# Patient Record
Sex: Female | Born: 1975 | Race: White | Hispanic: No | Marital: Married | Smoking: Never smoker
Health system: Southern US, Community
[De-identification: ages and names within clinical notes are randomized; demographics above are authoritative.]

## PROBLEM LIST (undated history)

## (undated) ENCOUNTER — Inpatient Hospital Stay (HOSPITAL_COMMUNITY): Payer: Self-pay

## (undated) DIAGNOSIS — G43909 Migraine, unspecified, not intractable, without status migrainosus: Secondary | ICD-10-CM

## (undated) HISTORY — PX: APPENDECTOMY: SHX54

## (undated) HISTORY — PX: TUBAL LIGATION: SHX77

---

## 2009-01-21 ENCOUNTER — Ambulatory Visit: Payer: Self-pay | Admitting: Family Medicine

## 2009-05-27 ENCOUNTER — Ambulatory Visit: Payer: Self-pay | Admitting: Family Medicine

## 2009-06-11 ENCOUNTER — Ambulatory Visit: Payer: Self-pay | Admitting: Family Medicine

## 2009-06-11 DIAGNOSIS — J069 Acute upper respiratory infection, unspecified: Secondary | ICD-10-CM | POA: Insufficient documentation

## 2009-06-11 DIAGNOSIS — J0100 Acute maxillary sinusitis, unspecified: Secondary | ICD-10-CM

## 2009-12-30 ENCOUNTER — Ambulatory Visit: Payer: Self-pay | Admitting: Emergency Medicine

## 2009-12-30 LAB — CONVERTED CEMR LAB
Specific Gravity, Urine: >=1.03
Urobilinogen, UA: 0.2

## 2009-12-31 ENCOUNTER — Encounter: Payer: Self-pay | Admitting: Emergency Medicine

## 2010-01-03 ENCOUNTER — Telehealth (INDEPENDENT_AMBULATORY_CARE_PROVIDER_SITE_OTHER): Payer: Self-pay

## 2010-05-09 NOTE — Assessment & Plan Note (Signed)
Summary: sinus pressure, cough-dry x 2 weeks rm 1   Vital Signs:  Patient Profile:   35 Years Old Female CC:      Cold & URI symptoms Height:     69.5 inches Weight:      150 pounds O2 Sat:      100 % O2 treatment:    Room Air Temp:     97.3 degrees F oral Pulse rate:   87 / minute Pulse rhythm:   regular Resp:     18 per minute BP sitting:   109 / 75  (right arm) Cuff size:   regular  Vitals Entered By: Areta Haber CMA (June 11, 2009 10:55 AM)                  Current Allergies: ! CODEINE ! KEFLEX   History of Present Illness Chief Complaint: Cold & URI symptoms History of Present Illness: Subjective:  Patient complains of onset of cold-like illness about 10 days ago with typical scratchy throat, nasal congestion, and cough.  All symptoms improved until the past two days when she developed increased facial pressure, sinus congestion and chills.  She is still having cough at night.  Current Meds DAYQUIL MULTI-SYMPTOM 30-325-10 MG/15ML LIQD (PSEUDOEPHEDRINE-APAP-DM) as directed CLARITHROMYCIN 500 MG TABS (CLARITHROMYCIN) One Tab by mouth two times a day BENZONATATE 200 MG CAPS (BENZONATATE) One by mouth hs as needed cough  REVIEW OF SYSTEMS Constitutional Symptoms       Complains of fever.     Denies chills, night sweats, weight loss, weight gain, and fatigue.  Eyes       Denies change in vision, eye pain, eye discharge, glasses, contact lenses, and eye surgery. Ear/Nose/Throat/Mouth       Complains of ear pain, frequent runny nose, sinus problems, and sore throat.      Denies hearing loss/aids, change in hearing, ear discharge, dizziness, frequent nose bleeds, hoarseness, and tooth pain or bleeding.      Comments: Pressure Respiratory       Complains of dry cough.      Denies productive cough, wheezing, shortness of breath, asthma, bronchitis, and emphysema/COPD.  Cardiovascular       Denies murmurs, chest pain, and tires easily with exhertion.     Gastrointestinal       Denies stomach pain, nausea/vomiting, diarrhea, constipation, blood in bowel movements, and indigestion. Genitourniary       Denies painful urination, kidney stones, and loss of urinary control. Neurological       Complains of headaches.      Denies paralysis, seizures, and fainting/blackouts. Musculoskeletal       Denies muscle pain, joint pain, joint stiffness, decreased range of motion, redness, swelling, muscle weakness, and gout.  Skin       Denies bruising, unusual mles/lumps or sores, and hair/skin or nail changes.  Psych       Denies mood changes, temper/anger issues, anxiety/stress, speech problems, depression, and sleep problems.  Past History:  Past Medical History: Last updated: 01/21/2009 Migraine  Past Surgical History: Last updated: 01/21/2009 Appendectomy  Family History: Last updated: 01/21/2009 Mother HTN, Hyperlipidemia, Arthritis Father CAD Brother Healthy  Social History: Last updated: 01/21/2009 Occupation: Married Former Smoker Alcohol use-yes Drug use-no Regular exercise-no  Risk Factors: Exercise: no (01/21/2009)  Risk Factors: Smoking Status: quit (01/21/2009)   Objective:  No acute distress  Eyes:  Pupils are equal, round, and reactive to light and accomdation.  Extraocular movement is intact.  Conjunctivae are  not inflamed.  Ears:  Canals normal.  Tympanic membranes normal.   Nose:  Normal septum.  Normal turbinates, mildly congested.  Maxillary sinus tenderness present.  Pharynx:  Normal  Lungs:  Clear to auscultation.  Breath sounds are equal.  Heart:  Regular rate and rhythm without murmurs, rubs, or gallops.  Abdomen:  Nontender without masses or hepatosplenomegaly.  Bowel sounds are present.  No CVA or flank tenderness.  Assessment New Problems: URI (ICD-465.9) ACUTE MAXILLARY SINUSITIS (ICD-461.0)   Plan New Medications/Changes: BENZONATATE 200 MG CAPS (BENZONATATE) One by mouth hs as needed  cough  #12 x 0, 06/11/2009, Donna Christen MD CLARITHROMYCIN 500 MG TABS (CLARITHROMYCIN) One Tab by mouth two times a day  #20 x 0, 06/11/2009, Donna Christen MD  New Orders: Est. Patient Level III [phone] Planning Comments:   Begin Biaxin, expectorant/decongestant, cough suprressant at night, topical decongestant Rest, increased fluids. Follow-up with PCP if not improving.   The patient and/or caregiver has been counseled thoroughly with regard to medications prescribed including dosage, schedule, interactions, rationale for use, and possible side effects and they verbalize understanding.  Diagnoses and expected course of recovery discussed and will return if not improved as expected or if the condition worsens. Patient and/or caregiver verbalized understanding.  Prescriptions: BENZONATATE 200 MG CAPS (BENZONATATE) One by mouth hs as needed cough  #12 x 0   Entered and Authorized by:   Donna Christen MD   Signed by:   Donna Christen MD on 06/11/2009   Method used:   Print then Give to Patient   RxID:   [id number] CLARITHROMYCIN 500 MG TABS (CLARITHROMYCIN) One Tab by mouth two times a day  #20 x 0   Entered and Authorized by:   Donna Christen MD   Signed by:   Donna Christen MD on 06/11/2009   Method used:   Print then Give to Patient   RxID:   [id number]   Patient Instructions: 1)  May use Mucinex D (guaifenesin with decongestant) twice daily for congestion. 2)  Increase fluid intake, rest. 3)  May use Afrin nasal spray (or generic oxymetazoline) twice daily for about 5 days.  Also recommend using saline nasal spray several times daily and/or saline nasal irrigation. 4)  Followup with family doctor if not improving 7 to 10 days.

## 2010-05-09 NOTE — Assessment & Plan Note (Signed)
Summary: FLU SHOT/TJ  Nurse Visit Flu Vaccine Consent Questions     Do you have a history of severe allergic reactions to this vaccine? no    Any prior history of allergic reactions to egg and/or gelatin? no    Do you have a sensitivity to the preservative Thimersol? no    Do you have a past history of Guillan-Barre Syndrome? no    Do you currently have an acute febrile illness? no    Have you ever had a severe reaction to latex? no    Vaccine information given and explained to patient? yes    Are you currently pregnant? no    Lot Number:[id number]   Exp Date:07/07/2009   Manufacturer: [employer]    Site Given  right Deltoid IM   Vital Signs:  Patient profile:   35 year old female Temp:     98.1 degrees F oral  Vitals Entered By: Lajean Saver, RN  Allergies: 1)  ! Codeine  Orders Added: 1)  Admin 1st Vaccine [90471] 2)  Flu Vaccine 26yrs + [57846]

## 2010-05-09 NOTE — Assessment & Plan Note (Signed)
Summary: POSSIBLE UTI/WB   Vital Signs:  Patient Profile:   35 Years Old Melanie [name] CC:      dysuria, pelvic pain intermittent LMP:     12/14/2009 Height:     69.5 inches O2 Sat:      100 % O2 treatment:    Room Air Pulse rate:   Melanie / minute Resp:     12 per minute BP sitting:   108 / 78  (left arm) Cuff size:   regular  Pt. in pain?   yes    Location:   pelvis    Type:       sharp/pressure  Vitals Entered By: Lajean Saver RN (December 30, 2009 8:06 AM)  Menstrual History: LMP (date): 12/14/2009 LMP - Character: normal                   Updated Prior Medication List: No Medications Current Allergies (reviewed today): ! CODEINE ! KEFLEXHistory of Present Illness History from: patient Chief Complaint: dysuria, pelvic pain intermittent History of Present Illness: Patient complains of UTI symptoms off and on for 6 weeks, worse this past week  She describes the pain as burning during urination.  She has used OTC Azo and Cranberry which has helped. + dysuria + frequency + urgency No hematuria Mild vaginal discharge No fever/chills No lower abdomenal pain + Mild L back pain   REVIEW OF SYSTEMS Constitutional Symptoms      Denies fever, chills, night sweats, weight loss, weight gain, and fatigue.  Eyes       Denies change in vision, eye pain, eye discharge, glasses, contact lenses, and eye surgery. Ear/Nose/Throat/Mouth       Denies hearing loss/aids, change in hearing, ear pain, ear discharge, dizziness, frequent runny nose, frequent nose bleeds, sinus problems, sore throat, hoarseness, and tooth pain or bleeding.  Respiratory       Denies dry cough, productive cough, wheezing, shortness of breath, asthma, bronchitis, and emphysema/COPD.  Cardiovascular       Denies murmurs, chest pain, and tires easily with exhertion.    Gastrointestinal       Denies stomach pain, nausea/vomiting, diarrhea, constipation, blood in bowel movements, and indigestion. Genitourniary       Complains of painful urination.      Denies blood or discharge from vagina, kidney stones, and loss of urinary control.      Comments: pelvic pain Neurological       Denies paralysis, seizures, and fainting/blackouts. Musculoskeletal       Denies muscle pain, joint pain, joint stiffness, decreased range of motion, redness, swelling, muscle weakness, and gout.  Skin       Denies bruising, unusual mles/lumps or sores, and hair/skin or nail changes.  Psych       Denies mood changes, temper/anger issues, anxiety/stress, speech problems, depression, and sleep problems.  Past History:  Past Medical History: Reviewed history from 01/21/2009 and no changes required. Migraine  Past Surgical History: Reviewed history from 01/21/2009 and no changes required. Appendectomy  Family History: Reviewed history from 01/21/2009 and no changes required. Mother HTN, Hyperlipidemia, Arthritis Father CAD Brother Healthy  Social History:  Married Former Smoker Alcohol use-yes Drug use-no Regular exercise-no Physical Exam General appearance: well developed, well nourished, no acute distress Chest/Lungs: no rales, wheezes, or rhonchi bilateral, breath sounds equal without effort Heart: regular rate and  rhythm, no murmur Abdomen: soft, non-tender without obvious organomegaly Back: no CVAT Skin: no obvious rashes or lesions MSE: oriented to time, place,  and person Assessment New Problems: URINARY TRACT INFECTION (ICD-599.0)  Likely UTI  Patient Education: Patient and/or caregiver instructed in the following: rest, fluids.  Plan New Medications/Changes: BACTRIM DS 800-160 MG TABS (SULFAMETHOXAZOLE-TRIMETHOPRIM) 1 tab by mouth two times a day for 7 days  #14 x 0, 12/30/2009, Hoyt Koch MD  New Orders: Est. Patient Level III [phone] UA Dipstick w/o Micro (automated)  [81003] Urine Pregnancy Test  [81025] T-Culture, Urine [[id number]] Planning Comments:   Hydration OTC Azo  for 2 days if needed Wipe front to back Follow-up with your primary care physician if not improving or if getting worse.  Or next week for your annual pap smear   The patient and/or caregiver has been counseled thoroughly with regard to medications prescribed including dosage, schedule, interactions, rationale for use, and possible side effects and they verbalize understanding.  Diagnoses and expected course of recovery discussed and will return if not improved as expected or if the condition worsens. Patient and/or caregiver verbalized understanding.  Prescriptions: BACTRIM DS 800-160 MG TABS (SULFAMETHOXAZOLE-TRIMETHOPRIM) 1 tab by mouth two times a day for 7 days  #14 x 0   Entered and Authorized by:   Hoyt Koch MD   Signed by:   Hoyt Koch MD on 12/30/2009   Method used:   Printed then faxed to ...       [facility]* (retail)       [address] [place], [place]  [address]       Ph: [phone]       Fax: [phone]   RxID:   [id number]   Orders Added: 1)  Est. Patient Level III [36144] 2)  UA Dipstick w/o Micro (automated)  [81003] 3)  Urine Pregnancy Test  [81025] 4)  T-Culture, Urine [[id number]]  Laboratory Results   Urine Tests  Date/Time Received: December 30, 2009 8:29 AM  Date/Time Reported: December 30, 2009 8:29 AM   Routine Urinalysis   Color: yellow Appearance: Cloudy Glucose: negative   (Normal Range: Negative) Bilirubin: 1+   (Normal Range: Negative) Ketone: negative   (Normal Range: Negative) Spec. Gravity: >=1.030   (Normal Range: 1.003-1.035) Blood: 1+   (Normal Range: Negative) pH: 5.5   (Normal Range: 5.0-8.0) Protein: 1+   (Normal Range: Negative) Urobilinogen: 0.2   (Normal Range: 0-1) Nitrite: negative   (Normal Range: Negative) Leukocyte Esterace: 3+   (Normal Range: Negative)    Urine HCG: negative

## 2010-05-09 NOTE — Progress Notes (Signed)
  Phone Note Outgoing Call   Call placed by: Areta Haber CMA,  January 03, 2010 7:43 PM Call placed to: Patient Summary of Call: Courtesy call made to pt - LMOVM of cell. Initial call taken by: Areta Haber CMA,  January 03, 2010 7:44 PM

## 2011-07-16 ENCOUNTER — Encounter: Payer: Self-pay | Admitting: Emergency Medicine

## 2011-07-16 ENCOUNTER — Emergency Department (INDEPENDENT_AMBULATORY_CARE_PROVIDER_SITE_OTHER)
Admission: EM | Admit: 2011-07-16 | Discharge: 2011-07-16 | Disposition: A | Discharge status: Home / Self Care | Payer: Commercial Managed Care - PPO | Source: Home / Self Care | Attending: Family Medicine | Admitting: Family Medicine

## 2011-07-16 DIAGNOSIS — J0100 Acute maxillary sinusitis, unspecified: Secondary | ICD-10-CM

## 2011-07-16 DIAGNOSIS — H6980 Other specified disorders of Eustachian tube, unspecified ear: Secondary | ICD-10-CM

## 2011-07-16 MED ORDER — SULFAMETHOXAZOLE-TRIMETHOPRIM 800-160 MG PO TABS: 1.0000 | ORAL_TABLET | Freq: Two times a day (BID) | ORAL | Status: AC

## 2011-07-16 NOTE — ED Provider Notes (Signed)
History     CSN: [id number]  Arrival date & time 07/16/11  1256   First MD Initiated Contact with Patient 07/16/11 1308      Chief Complaint  Patient presents with  . Sinus Problem     HPI Comments: Patient complains of onset of a cough about two weeks ago; she was seen in ER and chest X-ray was negative.  Her cough is much improved although still present.  She then developed sinus congestion that has persisted.  Over the past several days she has developed increased left facial pain and soreness in her left upper teeth.  No sore throat.  She denies fever, but has had "hot flashes" past several days.  She also feels that her left ear has been clogged.  The history is provided by the patient.    History reviewed. No pertinent past medical history.  Past Surgical History  Procedure Date  . Appendectomy     Family History  Problem Relation Age of Onset  . Hypertension Mother   . Diabetes Mother   . Osteoarthritis Mother   . Coronary artery disease Father     History  Substance Use Topics  . Smoking status: Never Smoker   . Smokeless tobacco: Not on file  . Alcohol Use: No    OB History    Grav Para Term Preterm Abortions TAB SAB Ect Mult Living                  Review of Systems No sore throat + cough No pleuritic pain No wheezing + nasal congestion + post-nasal drainage + left sinus pain/pressure No itchy/red eyes ? left earache No hemoptysis No SOB No fever/chills No nausea No vomiting No abdominal pain No diarrhea No urinary symptoms No skin rashes + fatigue No myalgias + headache Used OTC meds without relief (Claritin) Allergies  Cephalexin and Codeine  Home Medications   Current Outpatient Rx  Name Route Sig Dispense Refill  . LORATADINE 10 MG PO TABS Oral Take 10 mg by mouth daily.    . SULFAMETHOXAZOLE-TRIMETHOPRIM 800-160 MG PO TABS Oral Take 1 tablet by mouth 2 (two) times daily. 20 tablet 0    BP 114/78  Pulse 85  Temp(Src) 98.3  F (36.8 C) (Oral)  Resp 16  Ht 5\' 10"  (1.778 m)  Wt 174 lb (78.926 kg)  BMI 24.97 kg/m2  SpO2 99%  Physical Exam Nursing notes and Vital Signs reviewed. Appearance:  Patient appears healthy, stated age, and in no acute distress Eyes:  Pupils are equal, round, and reactive to light and accomodation.  Extraocular movement is intact.  Conjunctivae are not inflamed  Ears:  Canals normal.  Tympanic membranes normal.  Nose:  Mildly congested turbinates.  Left maxillary sinus tenderness is present. Mouth:  Normal; no tooth or gingiva tenderness  Pharynx:  Normal Neck:  Supple.  Slightly tender shotty posterior nodes are palpated bilaterally  Lungs:  Clear to auscultation.  Breath sounds are equal.  Heart:  Regular rate and rhythm without murmurs, rubs, or gallops.  Skin:  No rash present.   ED Course  Procedures  none   Tympanogram:  Normal bilaterally   1. Acute maxillary sinusitis   2. Eustachian tube dysfunction   Suspect a resolving URI with secondary sinusitis.    MDM  Begin Septra DS Take Mucinex D (guaifenesin with decongestant) twice daily for congestion.  Increase fluid intake, rest. May use Afrin nasal spray (or generic oxymetazoline) twice daily for about 5  days.  Also recommend using saline nasal spray several times daily and saline nasal irrigation (AYR is a common brand) Stop all antihistamines for now, and other non-prescription cough/cold preparations. Follow-up with ENT doctor if not improving 7 to 10 days.         Lattie Haw, MD 07/16/11 1349

## 2011-07-16 NOTE — ED Notes (Signed)
Left side nasal pain and pressure, congestion x 10 days, patient is breast feeding

## 2011-07-16 NOTE — Discharge Instructions (Signed)
Take Mucinex D (guaifenesin with decongestant) twice daily for congestion.  Increase fluid intake, rest. May use Afrin nasal spray (or generic oxymetazoline) twice daily for about 5 days.  Also recommend using saline nasal spray several times daily and saline nasal irrigation (AYR is a common brand) Stop all antihistamines for now, and other non-prescription cough/cold preparations. Follow-up with ENT doctor if not improving 7 to 10 days.

## 2011-11-02 ENCOUNTER — Encounter: Payer: Self-pay | Admitting: *Deleted

## 2011-11-02 ENCOUNTER — Emergency Department (INDEPENDENT_AMBULATORY_CARE_PROVIDER_SITE_OTHER)
Admission: EM | Admit: 2011-11-02 | Discharge: 2011-11-02 | Disposition: A | Discharge status: Home / Self Care | Payer: Commercial Managed Care - PPO | Source: Home / Self Care

## 2011-11-02 DIAGNOSIS — J020 Streptococcal pharyngitis: Secondary | ICD-10-CM

## 2011-11-02 DIAGNOSIS — J029 Acute pharyngitis, unspecified: Secondary | ICD-10-CM

## 2011-11-02 HISTORY — DX: Migraine, unspecified, not intractable, without status migrainosus: G43.909

## 2011-11-02 MED ORDER — AZITHROMYCIN 250 MG PO TABS: ORAL_TABLET | ORAL | Status: AC

## 2011-11-02 NOTE — ED Provider Notes (Signed)
History     CSN: [id number]  Arrival date & time 11/02/11  0806   First MD Initiated Contact with Patient 11/02/11 0813      Chief Complaint  Patient presents with  . Sore Throat  HPI SORE THROAT  Onset: 1 day  Description: sore throat, mild headache  Modifying factors: both children recently diagnosed with strep throat.   Symptoms  Fever:  no URI symptoms: no Cough: no Headache: yes Rash:  no Swollen glands:   no Recent Strep Exposure: yes LUQ pain: no Heartburn/brash: no Allergy Symptoms: no  Red Flags STD exposure: no Breathing difficulty: no Drooling: no Trismus: no   Past Medical History  Diagnosis Date  . Migraines     Past Surgical History  Procedure Date  . Appendectomy     Family History  Problem Relation Age of Onset  . Hypertension Mother   . Diabetes Mother   . Osteoarthritis Mother   . Coronary artery disease Father     History  Substance Use Topics  . Smoking status: Never Smoker   . Smokeless tobacco: Not on file  . Alcohol Use: No    OB History    Grav Para Term Preterm Abortions TAB SAB Ect Mult Living                  Review of Systems  All other systems reviewed and are negative.    Allergies  Cephalexin and Codeine  Home Medications   Current Outpatient Rx  Name Route Sig Dispense Refill  . AZITHROMYCIN 250 MG PO TABS  Take 2 tabs PO x 1 dose, then 1 tab PO QD x 4 days 6 tablet 0  . LORATADINE 10 MG PO TABS Oral Take 10 mg by mouth daily.      BP 110/76  Pulse 86  Temp 98.2 F (36.8 C) (Oral)  Resp 14  Ht 5\' 9"  (1.753 m)  Wt 172 lb (78.019 kg)  BMI 25.40 kg/m2  SpO2 99%  Physical Exam  Constitutional: She appears well-developed and well-nourished.  HENT:  Head: Normocephalic and atraumatic.  Left Ear: External ear normal.       + mild post oropharyngeal erythema   Eyes: Conjunctivae are normal. Pupils are equal, round, and reactive to light.  Neck: Normal range of motion. Neck supple.    Cardiovascular: Normal rate and regular rhythm.   Pulmonary/Chest: Effort normal and breath sounds normal.  Abdominal: Soft.  Musculoskeletal: Normal range of motion.  Lymphadenopathy:    She has no cervical adenopathy.  Neurological: She is alert.  Skin: Skin is warm.    ED Course  Procedures (including critical care time)   Labs Reviewed  POCT RAPID STREP A (OFFICE)  STREP A DNA PROBE   No results found.   No diagnosis found.    MDM  Rapid strep negative Will culture.  Given exposure, will treat.  Will start pt on azithromycin (keflex allergy-rash, will avoid PCN given cross reactivitiy).  Pt is breast feeding.  Discussed this with pharmacist at [facility]. Agrees. Category B for pregnancy with minimal passage into breast milk.  Clindamycin not recommended for breast feeding.  Discussed infectious red flags.  Handout given.  Follow up as needed.      The patient and/or caregiver has been counseled thoroughly with regard to treatment plan and/or medications prescribed including dosage, schedule, interactions, rationale for use, and possible side effects and they verbalize understanding. Diagnoses and expected course of recovery discussed and will return  if not improved as expected or if the condition worsens. Patient and/or caregiver verbalized understanding.             Floydene Flock, MD 11/02/11 [phone]

## 2011-11-02 NOTE — ED Notes (Signed)
C/o sore throat x this am. Both children diagnosed with strep yesterday.

## 2011-11-04 ENCOUNTER — Telehealth: Payer: Self-pay | Admitting: Emergency Medicine

## 2011-11-08 NOTE — ED Provider Notes (Signed)
Agree with exam, assessment, and plan.   Lattie Haw, MD 11/08/11 [phone]

## 2017-04-12 ENCOUNTER — Inpatient Hospital Stay (HOSPITAL_BASED_OUTPATIENT_CLINIC_OR_DEPARTMENT_OTHER)
Admission: EM | Admit: 2017-04-12 | Discharge: 2017-04-13 | Payer: Federal, State, Local not specified - PPO | Attending: Obstetrics & Gynecology | Admitting: Obstetrics & Gynecology

## 2017-04-12 ENCOUNTER — Emergency Department (HOSPITAL_BASED_OUTPATIENT_CLINIC_OR_DEPARTMENT_OTHER): Payer: Federal, State, Local not specified - PPO

## 2017-04-12 ENCOUNTER — Encounter (HOSPITAL_BASED_OUTPATIENT_CLINIC_OR_DEPARTMENT_OTHER): Payer: Self-pay | Admitting: Emergency Medicine

## 2017-04-12 ENCOUNTER — Other Ambulatory Visit: Payer: Self-pay

## 2017-04-12 DIAGNOSIS — R3914 Feeling of incomplete bladder emptying: Secondary | ICD-10-CM | POA: Diagnosis not present

## 2017-04-12 DIAGNOSIS — Z79899 Other long term (current) drug therapy: Secondary | ICD-10-CM | POA: Insufficient documentation

## 2017-04-12 DIAGNOSIS — O2390 Unspecified genitourinary tract infection in pregnancy, unspecified trimester: Secondary | ICD-10-CM | POA: Diagnosis not present

## 2017-04-12 DIAGNOSIS — R1011 Right upper quadrant pain: Secondary | ICD-10-CM | POA: Diagnosis not present

## 2017-04-12 DIAGNOSIS — O00101 Right tubal pregnancy without intrauterine pregnancy: Secondary | ICD-10-CM | POA: Diagnosis not present

## 2017-04-12 DIAGNOSIS — N3001 Acute cystitis with hematuria: Secondary | ICD-10-CM | POA: Insufficient documentation

## 2017-04-12 DIAGNOSIS — R102 Pelvic and perineal pain: Secondary | ICD-10-CM | POA: Diagnosis not present

## 2017-04-12 DIAGNOSIS — O9989 Other specified diseases and conditions complicating pregnancy, childbirth and the puerperium: Secondary | ICD-10-CM | POA: Insufficient documentation

## 2017-04-12 DIAGNOSIS — R1031 Right lower quadrant pain: Secondary | ICD-10-CM | POA: Diagnosis present

## 2017-04-12 DIAGNOSIS — N3000 Acute cystitis without hematuria: Secondary | ICD-10-CM

## 2017-04-12 LAB — CBC
HCT: 37.2 % (ref 36.0–46.0)
Hemoglobin: 12.6 g/dL (ref 12.0–15.0)
MCH: 29.2 pg (ref 26.0–34.0)
MCHC: 33.9 g/dL (ref 30.0–36.0)
MCV: 86.1 fL (ref 78.0–100.0)
PLATELETS: 281 10*3/uL (ref 150–400)
RBC: 4.32 MIL/uL (ref 3.87–5.11)
RDW: 13 % (ref 11.5–15.5)
WBC: 11.7 10*3/uL — ABNORMAL HIGH (ref 4.0–10.5)

## 2017-04-12 LAB — CBC WITH DIFFERENTIAL/PLATELET
Basophils Absolute: 0 10*3/uL (ref 0.0–0.1)
Basophils Relative: 0 %
Eosinophils Absolute: 0.2 10*3/uL (ref 0.0–0.7)
Eosinophils Relative: 2 %
HCT: 38.9 % (ref 36.0–46.0)
Hemoglobin: 13.4 g/dL (ref 12.0–15.0)
Lymphocytes Relative: 18 %
Lymphs Abs: 2.2 10*3/uL (ref 0.7–4.0)
MCH: 29.3 pg (ref 26.0–34.0)
MCHC: 34.4 g/dL (ref 30.0–36.0)
MCV: 85.1 fL (ref 78.0–100.0)
Monocytes Absolute: 0.7 10*3/uL (ref 0.1–1.0)
Monocytes Relative: 6 %
Neutro Abs: 9 10*3/uL — ABNORMAL HIGH (ref 1.7–7.7)
Neutrophils Relative %: 74 %
Platelets: 319 10*3/uL (ref 150–400)
RBC: 4.57 MIL/uL (ref 3.87–5.11)
RDW: 13 % (ref 11.5–15.5)
WBC: 12.2 10*3/uL — ABNORMAL HIGH (ref 4.0–10.5)

## 2017-04-12 LAB — COMPREHENSIVE METABOLIC PANEL
ALT: 17 U/L (ref 14–54)
AST: 19 U/L (ref 15–41)
Albumin: 4.3 g/dL (ref 3.5–5.0)
Alkaline Phosphatase: 40 U/L (ref 38–126)
Anion gap: 8 (ref 5–15)
BUN: 12 mg/dL (ref 6–20)
CO2: 24 mmol/L (ref 22–32)
Calcium: 8.8 mg/dL — ABNORMAL LOW (ref 8.9–10.3)
Chloride: 102 mmol/L (ref 101–111)
Creatinine, Ser: 0.85 mg/dL (ref 0.44–1.00)
GFR calc Af Amer: >60 mL/min (ref >60)
GFR calc non Af Amer: >60 mL/min (ref >60)
Glucose, Bld: 95 mg/dL (ref 65–99)
Potassium: 3.9 mmol/L (ref 3.5–5.1)
Sodium: 134 mmol/L — ABNORMAL LOW (ref 135–145)
Total Bilirubin: 0.3 mg/dL (ref 0.3–1.2)
Total Protein: 7.5 g/dL (ref 6.5–8.1)

## 2017-04-12 LAB — URINALYSIS, ROUTINE W REFLEX MICROSCOPIC
Bilirubin Urine: NEGATIVE
GLUCOSE, UA: NEGATIVE mg/dL
HGB URINE DIPSTICK: NEGATIVE
Ketones, ur: NEGATIVE mg/dL
Nitrite: POSITIVE — AB
PH: 6 (ref 5.0–8.0)
PROTEIN: NEGATIVE mg/dL

## 2017-04-12 LAB — WET PREP, GENITAL
Sperm: NONE SEEN
Trich, Wet Prep: NONE SEEN
Yeast Wet Prep HPF POC: NONE SEEN

## 2017-04-12 LAB — URINALYSIS, MICROSCOPIC (REFLEX): RBC / HPF: NONE SEEN RBC/hpf (ref 0–5)

## 2017-04-12 LAB — HCG, QUANTITATIVE, PREGNANCY: hCG, Beta Chain, Quant, S: 1734 m[IU]/mL — ABNORMAL HIGH (ref <5)

## 2017-04-12 LAB — LIPASE, BLOOD: Lipase: 32 U/L (ref 11–51)

## 2017-04-12 LAB — PREGNANCY, URINE: Preg Test, Ur: POSITIVE — AB

## 2017-04-12 MED ORDER — SODIUM CHLORIDE 0.9 % IV BOLUS (SEPSIS)
500.0000 mL | Freq: Once | INTRAVENOUS | Status: AC
  Administered 2017-04-12: 500 mL via INTRAVENOUS

## 2017-04-12 MED ORDER — METHOTREXATE INJECTION FOR WOMEN'S HOSPITAL
50.0000 mg/m2 | Freq: Once | INTRAMUSCULAR | Status: AC
  Administered 2017-04-12: 100 mg via INTRAMUSCULAR
  Filled 2017-04-12: qty 2

## 2017-04-12 MED ORDER — ONDANSETRON HCL 4 MG/2ML IJ SOLN
4.0000 mg | Freq: Once | INTRAMUSCULAR | Status: AC
  Administered 2017-04-12: 4 mg via INTRAVENOUS
  Filled 2017-04-12: qty 2

## 2017-04-12 MED ORDER — NITROFURANTOIN MONOHYD MACRO 100 MG PO CAPS
100.0000 mg | ORAL_CAPSULE | Freq: Once | ORAL | Status: AC
  Administered 2017-04-12: 100 mg via ORAL
  Filled 2017-04-12: qty 1

## 2017-04-12 NOTE — ED Notes (Signed)
ED Provider at bedside. 

## 2017-04-12 NOTE — ED Notes (Signed)
Pt in [place], will obtain vs when pt returns.

## 2017-04-12 NOTE — ED Triage Notes (Signed)
Patient reports abdominal pain which began today.  Reports that while driving today she had an intense right sided flank pain which caused her to feel lightheaded and nauseated.  Denies dysuria, hematuria.

## 2017-04-12 NOTE — MAU Note (Signed)
From [facility] by [facility]. Pt reports she was having intense sweating, nausea, right side mid abd pain radiated to back and into groin area. No bleeding.

## 2017-04-12 NOTE — ED Provider Notes (Signed)
THE [facility] ADMISSIONS Provider Note   CSN: [id number] Arrival date & time: 04/12/17  1734     History   Chief Complaint Chief Complaint  Patient presents with  . Flank Pain    HPI Melanie [name] is a 41 y.o. female with history of migraines and tubal ligation 2 years ago who presents with right upper and lower quadrant and flank pain began suddenly today.  At that time, she had associated lightheadedness and nausea.  She denies vomiting.  She reports she has had associated cloudy urination over the past few days, however she has been trying to increase her fluid intake.  She has had some incomplete emptying, but no frequency.  She reports she thinks she had her menstrual cycle 3 weeks ago, however since her tubal ligation, she has not paid all that much attention to it.  She has not had any abnormal vaginal bleeding or discharge.  She has had some associated right flank pain.  She denies any fevers, chest pain, shortness of breath, vomiting or diarrhea.  HPI  Past Medical History:  Diagnosis Date  . Migraines     Patient Active Problem List   Diagnosis Date Noted  . ACUTE MAXILLARY SINUSITIS 06/11/2009  . URI 06/11/2009    Past Surgical History:  Procedure Laterality Date  . APPENDECTOMY    . TUBAL LIGATION      OB History    Gravida Para Term Preterm AB Living   3 2 2     2    SAB TAB Ectopic Multiple Live Births           2       Home Medications    Prior to Admission medications   Medication Sig Start Date End Date Taking? Authorizing Provider  SUMAtriptan (IMITREX) 50 MG tablet Take 50 mg by mouth every 2 (two) hours as needed for migraine. May repeat in 2 hours if headache persists or recurs.   Yes [provider]  loratadine (CLARITIN) 10 MG tablet Take 10 mg by mouth daily.    [provider]  nitrofurantoin, macrocrystal-monohydrate, (MACROBID) 100 MG capsule Take 1 capsule (100 mg total) by mouth 2  (two) times daily. 04/13/17   Marny Lowenstein, PA-C    Family History Family History  Problem Relation Age of Onset  . Hypertension Mother   . Diabetes Mother   . Osteoarthritis Mother   . Coronary artery disease Father     Social History Social History   Tobacco Use  . Smoking status: Never Smoker  . Smokeless tobacco: Never Used  Substance Use Topics  . Alcohol use: No  . Drug use: No     Allergies   Cephalexin and Codeine   Review of Systems Review of Systems  Constitutional: Negative for chills and fever.  HENT: Negative for facial swelling and sore throat.   Respiratory: Negative for shortness of breath.   Cardiovascular: Negative for chest pain.  Gastrointestinal: Positive for abdominal pain and nausea. Negative for vomiting.  Genitourinary: Positive for flank pain. Negative for dysuria, frequency, vaginal bleeding and vaginal discharge.  Musculoskeletal: Negative for back pain.  Skin: Negative for rash and wound.  Neurological: Positive for light-headedness. Negative for headaches.  Psychiatric/Behavioral: The patient is not nervous/anxious.      Physical Exam Updated Vital Signs BP 102/72   Pulse 88   Temp 98.6 F (37 C) (Oral)   Resp 16   Ht 5\' 10"  (1.778 m)  Wt 79.4 kg (175 lb)   LMP 03/22/2017 (Approximate)   SpO2 100%   BMI 25.11 kg/m   Physical Exam  Constitutional: She appears well-developed and well-nourished. No distress.  HENT:  Head: Normocephalic and atraumatic.  Mouth/Throat: Oropharynx is clear and moist. No oropharyngeal exudate.  Eyes: Conjunctivae are normal. Pupils are equal, round, and reactive to light. Right eye exhibits no discharge. Left eye exhibits no discharge. No scleral icterus.  Neck: Normal range of motion. Neck supple. No thyromegaly present.  Cardiovascular: Normal rate, regular rhythm, normal heart sounds and intact distal pulses. Exam reveals no gallop and no friction rub.  No murmur heard. Pulmonary/Chest:  Effort normal and breath sounds normal. No stridor. No respiratory distress. She has no wheezes. She has no rales.  Abdominal: Soft. Bowel sounds are normal. She exhibits no distension. There is tenderness in the right lower quadrant and left lower quadrant. There is no rebound, no guarding, no CVA tenderness and negative Murphy's sign.    Musculoskeletal: She exhibits no edema.  Lymphadenopathy:    She has no cervical adenopathy.  Neurological: She is alert. Coordination normal.  Skin: Skin is warm and dry. No rash noted. She is not diaphoretic. No pallor.  Psychiatric: She has a normal mood and affect.  Nursing note and vitals reviewed.    ED Treatments / Results  Labs (all labs ordered are listed, but only abnormal results are displayed) Labs Reviewed  WET PREP, GENITAL - Abnormal; Notable for the following components:      Result Value   Clue Cells Wet Prep HPF POC PRESENT (*)    WBC, Wet Prep HPF POC MANY (*)    All other components within normal limits  URINALYSIS, ROUTINE W REFLEX MICROSCOPIC - Abnormal; Notable for the following components:   APPearance CLOUDY (*)    Specific Gravity, Urine <1.005 (*)    Nitrite POSITIVE (*)    Leukocytes, UA SMALL (*)    All other components within normal limits  PREGNANCY, URINE - Abnormal; Notable for the following components:   Preg Test, Ur POSITIVE (*)    All other components within normal limits  URINALYSIS, MICROSCOPIC (REFLEX) - Abnormal; Notable for the following components:   Bacteria, UA MANY (*)    Squamous Epithelial / LPF 0-5 (*)    All other components within normal limits  COMPREHENSIVE METABOLIC PANEL - Abnormal; Notable for the following components:   Sodium 134 (*)    Calcium 8.8 (*)    All other components within normal limits  CBC WITH DIFFERENTIAL/PLATELET - Abnormal; Notable for the following components:   WBC 12.2 (*)    Neutro Abs 9.0 (*)    All other components within normal limits  HCG, QUANTITATIVE,  PREGNANCY - Abnormal; Notable for the following components:   hCG, Beta Chain, Quant, S 1,734 (*)    All other components within normal limits  CBC - Abnormal; Notable for the following components:   WBC 11.7 (*)    All other components within normal limits  LIPASE, BLOOD  ABO/RH  GC/CHLAMYDIA PROBE AMP ([place]) NOT AT [facility]    EKG  EKG Interpretation None       Radiology US Ob Comp < 14 Wks  Result Date: 04/12/2017 CLINICAL DATA:  Right flank pain and diaphoresis today. History of tubal ligations 2 years ago. Uncertain LMP, possibly 3 weeks ago. Quantitative beta HCG is 1,734. EXAM: OBSTETRIC <14 WK [place] AND TRANSVAGINAL OB US DOPPLER ULTRASOUND OF OVARIES TECHNIQUE: Both transabdominal  and transvaginal ultrasound examinations were performed for complete evaluation of the gestation as well as the maternal uterus, adnexal regions, and pelvic cul-de-sac. Transvaginal technique was performed to assess early pregnancy. Color and duplex Doppler ultrasound was utilized to evaluate blood flow to the ovaries. COMPARISON:  None. FINDINGS: Intrauterine gestational sac: None Yolk sac:  Not Visualized. Embryo:  Not Visualized. Cardiac Activity: Not Visualized. Maternal uterus/adnexae: The uterus is mildly anteverted and is normal in size, measuring 8.1 x 4.2 x 4.8 cm. Small nabothian cysts in the cervix. Endometrial stripe thickness is normal no endometrial fluid or focal abnormality demonstrated. The right ovary measures 4.5 x 2.7 x 2.6 cm. Within the right ovary, there is a complex cyst with diffuse internal echoes measuring 3.1 cm maximal diameter. This likely represents a hemorrhagic cyst. In the right adnexa, adjacent to the right ovary, there is an additional complex cystic structure measuring about 1.2 cm maximal diameter. This structure is mostly anechoic but contains a tiny peripheral cystic nodular focus which may indicate a yolk cyst. No definite fetal pole or flow is identified. The left ovary  measures 2.1 x 2.6 x 2.2 cm and contains normal follicular changes. No abnormal adnexal masses demonstrated. Pulsed Doppler evaluation of both ovaries demonstrates normal appearing low-resistance arterial and venous waveforms. Flow is demonstrated in both ovaries on color flow Doppler imaging. There is moderate free fluid in the pelvis with some complexity suggesting hemorrhagic fluid. IMPRESSION: 1. No intrauterine gestational sac is identified. Differential diagnosis would include early intrauterine pregnancy too early to visualize, recent missed spontaneous abortion, or ectopic pregnancy. 2. Small cystic structure in the right adnexum with possible yolk sac. Appearance is suspicious for ectopic pregnancy. Moderate free fluid in the pelvis appears hemorrhagic. OBGYN referral for close clinical follow-up is recommended. 3. Hemorrhagic cyst demonstrated in the right ovary. These results were called by telephone at the time of interpretation on 04/12/2017 at 9:00 pm to PA. Ndea Kilroy , who verbally acknowledged these results. Electronically Signed   By: Burman NievesWilliam  Stevens M.D.   On: 04/12/2017 21:02   [place] Ob Transvaginal  Result Date: 04/12/2017 CLINICAL DATA:  Right flank pain and diaphoresis today. History of tubal ligations 2 years ago. Uncertain LMP, possibly 3 weeks ago. Quantitative beta HCG is 1,734. EXAM: OBSTETRIC <14 WK US AND TRANSVAGINAL OB US DOPPLER ULTRASOUND OF OVARIES TECHNIQUE: Both transabdominal and transvaginal ultrasound examinations were performed for complete evaluation of the gestation as well as the maternal uterus, adnexal regions, and pelvic cul-de-sac. Transvaginal technique was performed to assess early pregnancy. Color and duplex Doppler ultrasound was utilized to evaluate blood flow to the ovaries. COMPARISON:  None. FINDINGS: Intrauterine gestational sac: None Yolk sac:  Not Visualized. Embryo:  Not Visualized. Cardiac Activity: Not Visualized. Maternal uterus/adnexae: The uterus is  mildly anteverted and is normal in size, measuring 8.1 x 4.2 x 4.8 cm. Small nabothian cysts in the cervix. Endometrial stripe thickness is normal no endometrial fluid or focal abnormality demonstrated. The right ovary measures 4.5 x 2.7 x 2.6 cm. Within the right ovary, there is a complex cyst with diffuse internal echoes measuring 3.1 cm maximal diameter. This likely represents a hemorrhagic cyst. In the right adnexa, adjacent to the right ovary, there is an additional complex cystic structure measuring about 1.2 cm maximal diameter. This structure is mostly anechoic but contains a tiny peripheral cystic nodular focus which may indicate a yolk cyst. No definite fetal pole or flow is identified. The left ovary measures 2.1 x 2.6  x 2.2 cm and contains normal follicular changes. No abnormal adnexal masses demonstrated. Pulsed Doppler evaluation of both ovaries demonstrates normal appearing low-resistance arterial and venous waveforms. Flow is demonstrated in both ovaries on color flow Doppler imaging. There is moderate free fluid in the pelvis with some complexity suggesting hemorrhagic fluid. IMPRESSION: 1. No intrauterine gestational sac is identified. Differential diagnosis would include early intrauterine pregnancy too early to visualize, recent missed spontaneous abortion, or ectopic pregnancy. 2. Small cystic structure in the right adnexum with possible yolk sac. Appearance is suspicious for ectopic pregnancy. Moderate free fluid in the pelvis appears hemorrhagic. OBGYN referral for close clinical follow-up is recommended. 3. Hemorrhagic cyst demonstrated in the right ovary. These results were called by telephone at the time of interpretation on 04/12/2017 at 9:00 pm to PA. Toryn Mcclinton , who verbally acknowledged these results. Electronically Signed   By: Burman Nieves M.D.   On: 04/12/2017 21:02   [place] Art/ven Flow Abd Pelv Doppler  Result Date: 04/12/2017 CLINICAL DATA:  Right flank pain and diaphoresis  today. History of tubal ligations 2 years ago. Uncertain LMP, possibly 3 weeks ago. Quantitative beta HCG is 1,734. EXAM: OBSTETRIC <14 WK [place] AND TRANSVAGINAL OB US DOPPLER ULTRASOUND OF OVARIES TECHNIQUE: Both transabdominal and transvaginal ultrasound examinations were performed for complete evaluation of the gestation as well as the maternal uterus, adnexal regions, and pelvic cul-de-sac. Transvaginal technique was performed to assess early pregnancy. Color and duplex Doppler ultrasound was utilized to evaluate blood flow to the ovaries. COMPARISON:  None. FINDINGS: Intrauterine gestational sac: None Yolk sac:  Not Visualized. Embryo:  Not Visualized. Cardiac Activity: Not Visualized. Maternal uterus/adnexae: The uterus is mildly anteverted and is normal in size, measuring 8.1 x 4.2 x 4.8 cm. Small nabothian cysts in the cervix. Endometrial stripe thickness is normal no endometrial fluid or focal abnormality demonstrated. The right ovary measures 4.5 x 2.7 x 2.6 cm. Within the right ovary, there is a complex cyst with diffuse internal echoes measuring 3.1 cm maximal diameter. This likely represents a hemorrhagic cyst. In the right adnexa, adjacent to the right ovary, there is an additional complex cystic structure measuring about 1.2 cm maximal diameter. This structure is mostly anechoic but contains a tiny peripheral cystic nodular focus which may indicate a yolk cyst. No definite fetal pole or flow is identified. The left ovary measures 2.1 x 2.6 x 2.2 cm and contains normal follicular changes. No abnormal adnexal masses demonstrated. Pulsed Doppler evaluation of both ovaries demonstrates normal appearing low-resistance arterial and venous waveforms. Flow is demonstrated in both ovaries on color flow Doppler imaging. There is moderate free fluid in the pelvis with some complexity suggesting hemorrhagic fluid. IMPRESSION: 1. No intrauterine gestational sac is identified. Differential diagnosis would include  early intrauterine pregnancy too early to visualize, recent missed spontaneous abortion, or ectopic pregnancy. 2. Small cystic structure in the right adnexum with possible yolk sac. Appearance is suspicious for ectopic pregnancy. Moderate free fluid in the pelvis appears hemorrhagic. OBGYN referral for close clinical follow-up is recommended. 3. Hemorrhagic cyst demonstrated in the right ovary. These results were called by telephone at the time of interpretation on 04/12/2017 at 9:00 pm to PA. Mikaylee Arseneau , who verbally acknowledged these results. Electronically Signed   By: Burman Nieves M.D.   On: 04/12/2017 21:02    Procedures Procedures (including critical care time)  Medications Ordered in ED Medications  sodium chloride 0.9 % bolus 500 mL (0 mLs Intravenous Stopped 04/12/17 1924)  nitrofurantoin (macrocrystal-monohydrate) (MACROBID) capsule 100 mg (100 mg Oral Given 04/12/17 2153)  ondansetron (ZOFRAN) injection 4 mg (4 mg Intravenous Given 04/12/17 2153)  methotrexate (50 mg/ml) chemo injection 100 mg (100 mg Intramuscular Given 04/12/17 2357)     Initial Impression / Assessment and Plan / ED Course  I have reviewed the triage vital signs and the nursing notes.  Pertinent labs & imaging results that were available during my care of the patient were reviewed by me and considered in my medical decision making (see chart for details).     Patient with acute onset right lower quadrant and flank pain beginning today.  Patient with positive urine pregnancy, however status post tubal ligation.  Pelvic ultrasound shows no intrauterine gestational sac, but small cystic structure in the right adnexum with possible yolk sac, suspicious for ectopic pregnancy; also hemorrhagic cyst in the right ovary.  HCG 1734.  Patient also with urinary tract infection.  Macrobid given in the ED.  Labs unremarkable except mild elevation in WBC, 12.2.  I consulted OB/GYN and spoke with Dr. Despina Hidden who advised transfer to  [facility] for methotrexate treatment.  Patient is agreeable with this plan, transfer will be arranged.  Patient also evaluated by Dr. Eudelia Bunch who guided the patient's management and agrees with plan.  Final Clinical Impressions(s) / ED Diagnoses   Final diagnoses:  Pelvic pain  Right tubal pregnancy without intrauterine pregnancy  Acute cystitis without hematuria    ED Discharge Orders        Ordered    nitrofurantoin, macrocrystal-monohydrate, (MACROBID) 100 MG capsule  2 times daily     04/13/17 0009       Emi Holes, PA-C 04/13/17 0031    Nira Conn, MD 04/13/17 [phone]

## 2017-04-12 NOTE — ED Notes (Signed)
Pt departing ED at this time enroute to [facility].

## 2017-04-12 NOTE — ED Notes (Signed)
ED Provider at bedside discussing plan of care for transport to [facility]

## 2017-04-12 NOTE — ED Notes (Signed)
Patient states she would not like any narcotic pain medication.

## 2017-04-13 DIAGNOSIS — R102 Pelvic and perineal pain: Secondary | ICD-10-CM

## 2017-04-13 LAB — ABO/RH: ABO/RH(D): O POS

## 2017-04-13 MED ORDER — NITROFURANTOIN MONOHYD MACRO 100 MG PO CAPS: 100.0000 mg | ORAL_CAPSULE | Freq: Two times a day (BID) | ORAL | 0 refills | Status: AC

## 2017-04-13 NOTE — MAU Provider Note (Signed)
History     CSN: [id number]  Arrival date and time: 04/12/17 1734   First Provider Initiated Contact with Patient 04/12/17 2244      Chief Complaint  Patient presents with  . Flank Pain   HPI  [name] is a 42 y.o. G3P2002 at [redacted]w[redacted]d who presents to MAU today as a transfer from [facility] for treatment of an ectopic pregnancy. The patient states that she was concerned about UTI symptoms initially which was why she presented to [facility]. She denies vaginal bleeding or fever.   OB History    Gravida Para Term Preterm AB Living   3 2 2     2    SAB TAB Ectopic Multiple Live Births           2      Past Medical History:  Diagnosis Date  . Migraines     Past Surgical History:  Procedure Laterality Date  . APPENDECTOMY    . TUBAL LIGATION      Family History  Problem Relation Age of Onset  . Hypertension Mother   . Diabetes Mother   . Osteoarthritis Mother   . Coronary artery disease Father     Social History   Tobacco Use  . Smoking status: Never Smoker  . Smokeless tobacco: Never Used  Substance Use Topics  . Alcohol use: No  . Drug use: No    Allergies:  Allergies  Allergen Reactions  . Cephalexin     REACTION: Rash  . Codeine     REACTION: Rash    No medications prior to admission.    Review of Systems  Constitutional: Negative for fever.  Gastrointestinal: Positive for abdominal pain.  Genitourinary: Positive for dysuria. Negative for vaginal bleeding.   Physical Exam   Blood pressure 102/72, pulse 88, temperature 98.6 F (37 C), temperature source Oral, resp. rate 16, height 5\' 10"  (1.778 m), weight 175 lb (79.4 kg), last menstrual period 03/22/2017, SpO2 100 %.  Physical Exam  Nursing note and vitals reviewed. Constitutional: She is oriented to person, place, and time. She appears well-developed and well-nourished. No distress.  HENT:  Head: Normocephalic and atraumatic.  Cardiovascular: Normal rate.  Respiratory:  Effort normal.  GI: Soft.  Neurological: She is alert and oriented to person, place, and time.  Skin: Skin is warm and dry. No erythema.  Psychiatric: She has a normal mood and affect.   Results for orders placed or performed during the hospital encounter of 04/12/17 (from the past 24 hour(s))  Urinalysis, Routine w reflex microscopic- may I&O cath if menses     Status: Abnormal   Collection Time: 04/12/17  5:44 PM  Result Value Ref Range   Color, Urine YELLOW YELLOW   APPearance CLOUDY (A) CLEAR   Specific Gravity, Urine <1.005 (L) 1.005 - 1.030   pH 6.0 5.0 - 8.0   Glucose, UA NEGATIVE NEGATIVE mg/dL   Hgb urine dipstick NEGATIVE NEGATIVE   Bilirubin Urine NEGATIVE NEGATIVE   Ketones, ur NEGATIVE NEGATIVE mg/dL   Protein, ur NEGATIVE NEGATIVE mg/dL   Nitrite POSITIVE (A) NEGATIVE   Leukocytes, UA SMALL (A) NEGATIVE  Pregnancy, urine     Status: Abnormal   Collection Time: 04/12/17  5:44 PM  Result Value Ref Range   Preg Test, Ur POSITIVE (A) NEGATIVE  Urinalysis, Microscopic (reflex)     Status: Abnormal   Collection Time: 04/12/17  5:44 PM  Result Value Ref Range   RBC / HPF NONE  SEEN 0 - 5 RBC/hpf   WBC, UA 6-30 0 - 5 WBC/hpf   Bacteria, UA MANY (A) NONE SEEN   Squamous Epithelial / LPF 0-5 (A) NONE SEEN  Comprehensive metabolic panel     Status: Abnormal   Collection Time: 04/12/17  6:46 PM  Result Value Ref Range   Sodium 134 (L) 135 - 145 mmol/L   Potassium 3.9 3.5 - 5.1 mmol/L   Chloride 102 101 - 111 mmol/L   CO2 24 22 - 32 mmol/L   Glucose, Bld 95 65 - 99 mg/dL   BUN 12 6 - 20 mg/dL   Creatinine, Ser 9.600.85 0.44 - 1.00 mg/dL   Calcium 8.8 (L) 8.9 - 10.3 mg/dL   Total Protein 7.5 6.5 - 8.1 g/dL   Albumin 4.3 3.5 - 5.0 g/dL   AST 19 15 - 41 U/L   ALT 17 14 - 54 U/L   Alkaline Phosphatase 40 38 - 126 U/L   Total Bilirubin 0.3 0.3 - 1.2 mg/dL   GFR calc non Af Amer >60 >60 mL/min   GFR calc Af Amer >60 >60 mL/min   Anion gap 8 5 - 15  CBC with Differential      Status: Abnormal   Collection Time: 04/12/17  6:46 PM  Result Value Ref Range   WBC 12.2 (H) 4.0 - 10.5 K/uL   RBC 4.57 3.87 - 5.11 MIL/uL   Hemoglobin 13.4 12.0 - 15.0 g/dL   HCT 45.438.9 09.836.0 - 11.946.0 %   MCV 85.1 78.0 - 100.0 fL   MCH 29.3 26.0 - 34.0 pg   MCHC 34.4 30.0 - 36.0 g/dL   RDW 14.713.0 82.911.5 - 56.215.5 %   Platelets 319 150 - 400 K/uL   Neutrophils Relative % 74 %   Neutro Abs 9.0 (H) 1.7 - 7.7 K/uL   Lymphocytes Relative 18 %   Lymphs Abs 2.2 0.7 - 4.0 K/uL   Monocytes Relative 6 %   Monocytes Absolute 0.7 0.1 - 1.0 K/uL   Eosinophils Relative 2 %   Eosinophils Absolute 0.2 0.0 - 0.7 K/uL   Basophils Relative 0 %   Basophils Absolute 0.0 0.0 - 0.1 K/uL  Lipase, blood     Status: None   Collection Time: 04/12/17  6:46 PM  Result Value Ref Range   Lipase 32 11 - 51 U/L  hCG, quantitative, pregnancy     Status: Abnormal   Collection Time: 04/12/17  6:46 PM  Result Value Ref Range   hCG, Beta Chain, Quant, S 1,734 (H) <5 mIU/mL  Wet prep, genital     Status: Abnormal   Collection Time: 04/12/17  7:17 PM  Result Value Ref Range   Yeast Wet Prep HPF POC NONE SEEN NONE SEEN   Trich, Wet Prep NONE SEEN NONE SEEN   Clue Cells Wet Prep HPF POC PRESENT (A) NONE SEEN   WBC, Wet Prep HPF POC MANY (A) NONE SEEN   Sperm NONE SEEN   CBC     Status: Abnormal   Collection Time: 04/12/17 10:54 PM  Result Value Ref Range   WBC 11.7 (H) 4.0 - 10.5 K/uL   RBC 4.32 3.87 - 5.11 MIL/uL   Hemoglobin 12.6 12.0 - 15.0 g/dL   HCT 13.037.2 86.536.0 - 78.446.0 %   MCV 86.1 78.0 - 100.0 fL   MCH 29.2 26.0 - 34.0 pg   MCHC 33.9 30.0 - 36.0 g/dL   RDW 69.613.0 29.511.5 - 28.415.5 %   Platelets 281  150 - 400 K/uL  ABO/Rh     Status: None   Collection Time: 04/12/17 10:54 PM  Result Value Ref Range   ABO/RH(D) O POS    US Ob Comp < 14 Wks  Result Date: 04/12/2017 CLINICAL DATA:  Right flank pain and diaphoresis today. History of tubal ligations 2 years ago. Uncertain LMP, possibly 3 weeks ago. Quantitative beta HCG is  1,734. EXAM: OBSTETRIC <14 WK [place] AND TRANSVAGINAL OB US DOPPLER ULTRASOUND OF OVARIES TECHNIQUE: Both transabdominal and transvaginal ultrasound examinations were performed for complete evaluation of the gestation as well as the maternal uterus, adnexal regions, and pelvic cul-de-sac. Transvaginal technique was performed to assess early pregnancy. Color and duplex Doppler ultrasound was utilized to evaluate blood flow to the ovaries. COMPARISON:  None. FINDINGS: Intrauterine gestational sac: None Yolk sac:  Not Visualized. Embryo:  Not Visualized. Cardiac Activity: Not Visualized. Maternal uterus/adnexae: The uterus is mildly anteverted and is normal in size, measuring 8.1 x 4.2 x 4.8 cm. Small nabothian cysts in the cervix. Endometrial stripe thickness is normal no endometrial fluid or focal abnormality demonstrated. The right ovary measures 4.5 x 2.7 x 2.6 cm. Within the right ovary, there is a complex cyst with diffuse internal echoes measuring 3.1 cm maximal diameter. This likely represents a hemorrhagic cyst. In the right adnexa, adjacent to the right ovary, there is an additional complex cystic structure measuring about 1.2 cm maximal diameter. This structure is mostly anechoic but contains a tiny peripheral cystic nodular focus which may indicate a yolk cyst. No definite fetal pole or flow is identified. The left ovary measures 2.1 x 2.6 x 2.2 cm and contains normal follicular changes. No abnormal adnexal masses demonstrated. Pulsed Doppler evaluation of both ovaries demonstrates normal appearing low-resistance arterial and venous waveforms. Flow is demonstrated in both ovaries on color flow Doppler imaging. There is moderate free fluid in the pelvis with some complexity suggesting hemorrhagic fluid. IMPRESSION: 1. No intrauterine gestational sac is identified. Differential diagnosis would include early intrauterine pregnancy too early to visualize, recent missed spontaneous abortion, or ectopic pregnancy. 2.  Small cystic structure in the right adnexum with possible yolk sac. Appearance is suspicious for ectopic pregnancy. Moderate free fluid in the pelvis appears hemorrhagic. OBGYN referral for close clinical follow-up is recommended. 3. Hemorrhagic cyst demonstrated in the right ovary. These results were called by telephone at the time of interpretation on 04/12/2017 at 9:00 pm to PA. ALEXANDRA LAW , who verbally acknowledged these results. Electronically Signed   By: Burman Nieves M.D.   On: 04/12/2017 21:02   US Ob Transvaginal  Result Date: 04/12/2017 CLINICAL DATA:  Right flank pain and diaphoresis today. History of tubal ligations 2 years ago. Uncertain LMP, possibly 3 weeks ago. Quantitative beta HCG is 1,734. EXAM: OBSTETRIC <14 WK [place] AND TRANSVAGINAL OB US DOPPLER ULTRASOUND OF OVARIES TECHNIQUE: Both transabdominal and transvaginal ultrasound examinations were performed for complete evaluation of the gestation as well as the maternal uterus, adnexal regions, and pelvic cul-de-sac. Transvaginal technique was performed to assess early pregnancy. Color and duplex Doppler ultrasound was utilized to evaluate blood flow to the ovaries. COMPARISON:  None. FINDINGS: Intrauterine gestational sac: None Yolk sac:  Not Visualized. Embryo:  Not Visualized. Cardiac Activity: Not Visualized. Maternal uterus/adnexae: The uterus is mildly anteverted and is normal in size, measuring 8.1 x 4.2 x 4.8 cm. Small nabothian cysts in the cervix. Endometrial stripe thickness is normal no endometrial fluid or focal abnormality demonstrated. The right ovary measures 4.5  x 2.7 x 2.6 cm. Within the right ovary, there is a complex cyst with diffuse internal echoes measuring 3.1 cm maximal diameter. This likely represents a hemorrhagic cyst. In the right adnexa, adjacent to the right ovary, there is an additional complex cystic structure measuring about 1.2 cm maximal diameter. This structure is mostly anechoic but contains a tiny  peripheral cystic nodular focus which may indicate a yolk cyst. No definite fetal pole or flow is identified. The left ovary measures 2.1 x 2.6 x 2.2 cm and contains normal follicular changes. No abnormal adnexal masses demonstrated. Pulsed Doppler evaluation of both ovaries demonstrates normal appearing low-resistance arterial and venous waveforms. Flow is demonstrated in both ovaries on color flow Doppler imaging. There is moderate free fluid in the pelvis with some complexity suggesting hemorrhagic fluid. IMPRESSION: 1. No intrauterine gestational sac is identified. Differential diagnosis would include early intrauterine pregnancy too early to visualize, recent missed spontaneous abortion, or ectopic pregnancy. 2. Small cystic structure in the right adnexum with possible yolk sac. Appearance is suspicious for ectopic pregnancy. Moderate free fluid in the pelvis appears hemorrhagic. OBGYN referral for close clinical follow-up is recommended. 3. Hemorrhagic cyst demonstrated in the right ovary. These results were called by telephone at the time of interpretation on 04/12/2017 at 9:00 pm to PA. ALEXANDRA LAW , who verbally acknowledged these results. Electronically Signed   By: Burman Nieves M.D.   On: 04/12/2017 21:02   [place] Art/ven Flow Abd Pelv Doppler  Result Date: 04/12/2017 CLINICAL DATA:  Right flank pain and diaphoresis today. History of tubal ligations 2 years ago. Uncertain LMP, possibly 3 weeks ago. Quantitative beta HCG is 1,734. EXAM: OBSTETRIC <14 WK [place] AND TRANSVAGINAL OB US DOPPLER ULTRASOUND OF OVARIES TECHNIQUE: Both transabdominal and transvaginal ultrasound examinations were performed for complete evaluation of the gestation as well as the maternal uterus, adnexal regions, and pelvic cul-de-sac. Transvaginal technique was performed to assess early pregnancy. Color and duplex Doppler ultrasound was utilized to evaluate blood flow to the ovaries. COMPARISON:  None. FINDINGS: Intrauterine  gestational sac: None Yolk sac:  Not Visualized. Embryo:  Not Visualized. Cardiac Activity: Not Visualized. Maternal uterus/adnexae: The uterus is mildly anteverted and is normal in size, measuring 8.1 x 4.2 x 4.8 cm. Small nabothian cysts in the cervix. Endometrial stripe thickness is normal no endometrial fluid or focal abnormality demonstrated. The right ovary measures 4.5 x 2.7 x 2.6 cm. Within the right ovary, there is a complex cyst with diffuse internal echoes measuring 3.1 cm maximal diameter. This likely represents a hemorrhagic cyst. In the right adnexa, adjacent to the right ovary, there is an additional complex cystic structure measuring about 1.2 cm maximal diameter. This structure is mostly anechoic but contains a tiny peripheral cystic nodular focus which may indicate a yolk cyst. No definite fetal pole or flow is identified. The left ovary measures 2.1 x 2.6 x 2.2 cm and contains normal follicular changes. No abnormal adnexal masses demonstrated. Pulsed Doppler evaluation of both ovaries demonstrates normal appearing low-resistance arterial and venous waveforms. Flow is demonstrated in both ovaries on color flow Doppler imaging. There is moderate free fluid in the pelvis with some complexity suggesting hemorrhagic fluid. IMPRESSION: 1. No intrauterine gestational sac is identified. Differential diagnosis would include early intrauterine pregnancy too early to visualize, recent missed spontaneous abortion, or ectopic pregnancy. 2. Small cystic structure in the right adnexum with possible yolk sac. Appearance is suspicious for ectopic pregnancy. Moderate free fluid in the pelvis appears hemorrhagic. OBGYN  referral for close clinical follow-up is recommended. 3. Hemorrhagic cyst demonstrated in the right ovary. These results were called by telephone at the time of interpretation on 04/12/2017 at 9:00 pm to PA. ALEXANDRA LAW , who verbally acknowledged these results. Electronically Signed   By: Burman Nieves M.D.   On: 04/12/2017 21:02    MAU Course  Procedures None  MDM Discussed MTX with patient. She is agreeable to receiving MTX at this time.  Labs reviewed from [facility].  CBC repeated - stable.  MTX given  Assessment and Plan  A: Ectopic pregnancy UTI, acute  P:  Discharge home Rx for Macrobid sent to patient's pharmacy  Warning signs for worsening condition discussed Patient advised to follow-up with [facility] on Monday for day#4 repeat hCG Patient may return to MAU as needed or if her condition were to change or worsen  Vonzella Nipple, PA-C 04/13/2017, 1:45 AM

## 2017-04-13 NOTE — Discharge Instructions (Signed)
Methotrexate Treatment for an Ectopic Pregnancy, Care After °Refer to this sheet in the next few weeks. These instructions provide you with information on caring for yourself after your procedure. Your health care provider may also give you more specific instructions. Your treatment has been planned according to current medical practices, but problems sometimes occur. Call your health care provider if you have any problems or questions after your procedure. °What can I expect after the procedure? °You may have some abdominal cramping, vaginal bleeding, and fatigue in the first few days after taking methotrexate. Some other possible side effects of methotrexate include: °· Nausea. °· Vomiting. °· Diarrhea. °· Mouth sores. °· Swelling or irritation of the lining of your lungs (pneumonitis). °· Liver damage. °· Hair loss. ° °Follow these instructions at home: °After you have received the methotrexate medicine, you need to be careful of your activities and watch your condition for several weeks. It may take 1 week before your hormone levels return to normal. °Activity °· Do not have sexual intercourse until your health care provider says it is safe to do so. °· You may resume your usual diet. °· Limit strenuous activity. °· Do not drink alcohol. °General instructions °· Do not take aspirin, ibuprofen, or naproxen (nonsteroidal anti-inflammatory drugs [NSAIDs]). °· Do not take folic acid, prenatal vitamins, or other vitamins that contain folic acid. °· Avoid traveling too far away from your health care provider. °· Keep all follow-up visits as told by your health care provider. This is important. °Contact a health care provider if: °· You cannot control your nausea and vomiting. °· You cannot control your diarrhea. °· You have sores in your mouth and want treatment. °· You need pain medicine for your abdominal pain. °· You have a rash. °· You are having a reaction to the medicine. °Get help right away if: °· You have  increasing abdominal or pelvic pain. °· You notice increased bleeding. °· You feel light-headed, or you faint. °· You have shortness of breath. °· Your heart rate increases. °· You have a cough. °· You have chills. °· You have a fever. °This information is not intended to replace advice given to you by your health care provider. Make sure you discuss any questions you have with your health care provider. °Document Released: 03/15/2011 Document Revised: 09/01/2015 Document Reviewed: 01/12/2013 °Elsevier Interactive Patient Education © 2017 [employer] ° °

## 2017-04-14 ENCOUNTER — Inpatient Hospital Stay (HOSPITAL_COMMUNITY)
Admission: AD | Admit: 2017-04-14 | Discharge: 2017-04-14 | Disposition: A | Discharge status: Home / Self Care | Payer: Federal, State, Local not specified - PPO | Source: Ambulatory Visit | Attending: Obstetrics & Gynecology | Admitting: Obstetrics & Gynecology

## 2017-04-14 ENCOUNTER — Encounter (HOSPITAL_COMMUNITY): Payer: Self-pay

## 2017-04-14 ENCOUNTER — Other Ambulatory Visit: Payer: Self-pay

## 2017-04-14 ENCOUNTER — Inpatient Hospital Stay (HOSPITAL_COMMUNITY): Payer: Federal, State, Local not specified - PPO

## 2017-04-14 DIAGNOSIS — Z3201 Encounter for pregnancy test, result positive: Secondary | ICD-10-CM | POA: Diagnosis not present

## 2017-04-14 DIAGNOSIS — O26891 Other specified pregnancy related conditions, first trimester: Secondary | ICD-10-CM | POA: Diagnosis not present

## 2017-04-14 DIAGNOSIS — O3680X0 Pregnancy with inconclusive fetal viability, not applicable or unspecified: Secondary | ICD-10-CM | POA: Diagnosis not present

## 2017-04-14 DIAGNOSIS — O26899 Other specified pregnancy related conditions, unspecified trimester: Secondary | ICD-10-CM | POA: Diagnosis present

## 2017-04-14 DIAGNOSIS — O008 Other ectopic pregnancy without intrauterine pregnancy: Secondary | ICD-10-CM | POA: Diagnosis not present

## 2017-04-14 DIAGNOSIS — O09521 Supervision of elderly multigravida, first trimester: Secondary | ICD-10-CM | POA: Insufficient documentation

## 2017-04-14 DIAGNOSIS — Z3A00 Weeks of gestation of pregnancy not specified: Secondary | ICD-10-CM | POA: Diagnosis not present

## 2017-04-14 DIAGNOSIS — N9489 Other specified conditions associated with female genital organs and menstrual cycle: Secondary | ICD-10-CM | POA: Diagnosis not present

## 2017-04-14 DIAGNOSIS — O009 Unspecified ectopic pregnancy without intrauterine pregnancy: Secondary | ICD-10-CM | POA: Insufficient documentation

## 2017-04-14 DIAGNOSIS — O3480 Maternal care for other abnormalities of pelvic organs, unspecified trimester: Secondary | ICD-10-CM | POA: Insufficient documentation

## 2017-04-14 DIAGNOSIS — R109 Unspecified abdominal pain: Secondary | ICD-10-CM | POA: Diagnosis present

## 2017-04-14 LAB — CBC
HCT: 35.7 % — ABNORMAL LOW (ref 36.0–46.0)
Hemoglobin: 12.3 g/dL (ref 12.0–15.0)
MCH: 30 pg (ref 26.0–34.0)
MCHC: 34.5 g/dL (ref 30.0–36.0)
MCV: 87.1 fL (ref 78.0–100.0)
PLATELETS: 245 10*3/uL (ref 150–400)
RBC: 4.1 MIL/uL (ref 3.87–5.11)
RDW: 13 % (ref 11.5–15.5)
WBC: 10.3 10*3/uL (ref 4.0–10.5)

## 2017-04-14 MED ORDER — OXYCODONE-ACETAMINOPHEN 5-325 MG PO TABS
1.0000 | ORAL_TABLET | Freq: Once | ORAL | Status: AC
  Administered 2017-04-14: 1 via ORAL
  Filled 2017-04-14: qty 1

## 2017-04-14 MED ORDER — OXYCODONE-ACETAMINOPHEN 5-325 MG PO TABS: 1.0000 | ORAL_TABLET | Freq: Four times a day (QID) | ORAL | 0 refills | Status: AC | PRN

## 2017-04-14 MED ORDER — KETOROLAC TROMETHAMINE 60 MG/2ML IM SOLN
60.0000 mg | INTRAMUSCULAR | Status: AC
  Administered 2017-04-14: 60 mg via INTRAMUSCULAR
  Filled 2017-04-14: qty 2

## 2017-04-14 NOTE — MAU Note (Signed)
Friday, was given methotrexate for an ectopic preg. Today, her abd got pretty distended. Pain went from being just on RLQ- went across abd and up into shoulder. Started about 1000. lightheaded from time to time

## 2017-04-14 NOTE — MAU Provider Note (Signed)
History     CSN: [id number]  Arrival date and time: 04/14/17 1357   First Provider Initiated Contact with Patient 04/14/17 1427      Chief Complaint  Patient presents with  . Abdominal Pain   HPI  Ms.  Melanie [name] is a 42 y.o. year old 733P2002 female at 6740w2d weeks gestation by LMP who presents to MAU reporting increased abdominal pain and bloating since receiving MTX for ectopic pregnancy on Friday 04/12/2017. She states that the pain was in the RLQ, but is now spread across the entire abdomen and up into her shoulder. The pain started today about 1000. She reports feeling lightheaded from time to time today. She last had something to eat at 1030 today.   Past Medical History:  Diagnosis Date  . Migraines     Past Surgical History:  Procedure Laterality Date  . APPENDECTOMY    . TUBAL LIGATION      Family History  Problem Relation Age of Onset  . Hypertension Mother   . Diabetes Mother   . Osteoarthritis Mother   . Coronary artery disease Father     Social History   Tobacco Use  . Smoking status: Never Smoker  . Smokeless tobacco: Never Used  Substance Use Topics  . Alcohol use: No  . Drug use: No    Allergies:  Allergies  Allergen Reactions  . Cephalexin Rash  . Codeine Rash    Childhood reaction.    Medications Prior to Admission  Medication Sig Dispense Refill Last Dose  . ibuprofen (ADVIL,MOTRIN) 200 MG tablet Take 600 mg by mouth every 6 (six) hours as needed for headache.   Past Month at Unknown time  . nitrofurantoin, macrocrystal-monohydrate, (MACROBID) 100 MG capsule Take 1 capsule (100 mg total) by mouth 2 (two) times daily. 14 capsule 0 04/14/2017 at Unknown time  . promethazine (PHENERGAN) 25 MG tablet Take 25 mg by mouth every 6 (six) hours as needed for nausea or vomiting.   04/13/2017 at Unknown time  . SUMAtriptan (IMITREX) 50 MG tablet Take 50 mg by mouth every 2 (two) hours as needed for migraine. May repeat in 2 hours if headache  persists or recurs.   Past Month at Unknown time    Review of Systems  Constitutional: Negative.   HENT: Negative.   Eyes: Negative.   Respiratory: Negative.   Cardiovascular: Negative.   Gastrointestinal: Negative.   Endocrine: Negative.   Genitourinary: Positive for pelvic pain.  Musculoskeletal:       Shoulder pain  Skin: Negative.   Allergic/Immunologic: Negative.   Neurological: Positive for light-headedness.  Hematological: Negative.   Psychiatric/Behavioral: Negative.    Physical Exam   Blood pressure 115/81, pulse (!) 107, temperature 98.1 F (36.7 C), temperature source Oral, resp. rate 16, weight 176 lb 12 oz (80.2 kg), last menstrual period 03/22/2017, SpO2 100 %.  Physical Exam  Nursing note and vitals reviewed. Constitutional: She is oriented to person, place, and time. She appears well-developed and well-nourished.  HENT:  Head: Normocephalic.  Eyes: Pupils are equal, round, and reactive to light.  Neck: Normal range of motion.  Cardiovascular: Normal rate, regular rhythm and normal heart sounds.  Respiratory: Effort normal and breath sounds normal.  GI: Bowel sounds are normal.  Genitourinary:  Genitourinary Comments: deferred  Musculoskeletal: Normal range of motion.  Neurological: She is alert and oriented to person, place, and time.  Skin: Skin is warm and dry.  Psychiatric: She has a normal mood and affect.  Her behavior is normal. Judgment and thought content normal.    MAU Course  Procedures  MDM CBC OB < 14 wks TVUS  *Consult with Dr. Despina Hidden @ 1500 - notified of patient's complaints, assessments, lab & U/S results, recommended tx plan d/c home return on D4 for rpt BHCG, given Toradol 60 mg IM now with Percocet 1 tab now, Rx Percocet for pain, rpt CBC today - ok to d/c home, if CBC results are stable  Results for orders placed or performed during the hospital encounter of 04/14/17 (from the past 24 hour(s))  CBC     Status: Abnormal   Collection  Time: 04/14/17  3:08 PM  Result Value Ref Range   WBC 10.3 4.0 - 10.5 K/uL   RBC 4.10 3.87 - 5.11 MIL/uL   Hemoglobin 12.3 12.0 - 15.0 g/dL   HCT 52.8 (L) 41.3 - 24.4 %   MCV 87.1 78.0 - 100.0 fL   MCH 30.0 26.0 - 34.0 pg   MCHC 34.5 30.0 - 36.0 g/dL   RDW 01.0 27.2 - 53.6 %   Platelets 245 150 - 400 K/uL    US Ob Transvaginal  Result Date: 04/14/2017 CLINICAL DATA:  42 year old female with positive pregnancy test, probably ectopic pregnancy and treated with methotrexate yesterday. EXAM: TRANSVAGINAL OB ULTRASOUND TECHNIQUE: Transvaginal ultrasound was performed for complete evaluation of the gestation as well as the maternal uterus, adnexal regions, and pelvic cul-de-sac. COMPARISON:  04/12/2017 ultrasound FINDINGS: Intrauterine gestational sac: None Maternal uterus/adnexae: A 1.3 x 0.9 x 1.1 cm cystic structure in the right adnexal region with surrounding soft tissue is again noted, unchanged from the prior study and highly suspicious for ectopic pregnancy. A moderate amount of free pelvic fluid is stable to slightly decreased in size. The uterus, endometrial stripe and ovaries are unremarkable. IMPRESSION: Unchanged right adnexal cystic mass highly suspicious for ectopic pregnancy. Stable to slight decrease in moderate free pelvic fluid. Critical Value/emergent results were called by telephone at the time of interpretation on 04/14/2017 at 3:19 pm to [facility] , who verbally acknowledged these results. Electronically Signed   By: Harmon Pier M.D.   On: 04/14/2017 15:19    Assessment and Plan  Other ectopic pregnancy without intrauterine pregnancy - RT adnexa - Return to [facility] for repeat BHCG on 04/15/2017 - Information provided on ectopic pregnancy, MTX - Reviewed s/s to return to MAU for   Abdominal pain affecting pregnancy  - Rx for Percocet 5/325 mg every 6 hrs prn pain - Return to MAU for increased pain not relieved with Percocet  Discharge home Patient verbalized an  understanding of the plan of care and agrees.   Raelyn Mora, MSN, CNM 04/14/2017, 2:30 PM

## 2017-04-15 ENCOUNTER — Ambulatory Visit: Payer: Federal, State, Local not specified - PPO | Admitting: General Practice

## 2017-04-15 DIAGNOSIS — O00101 Right tubal pregnancy without intrauterine pregnancy: Secondary | ICD-10-CM

## 2017-04-15 LAB — GC/CHLAMYDIA PROBE AMP (~~LOC~~) NOT AT ARMC
Chlamydia: NEGATIVE
Neisseria Gonorrhea: NEGATIVE

## 2017-04-15 LAB — HCG, QUANTITATIVE, PREGNANCY: hCG, Beta Chain, Quant, S: 204 m[IU]/mL — ABNORMAL HIGH (ref <5)

## 2017-04-15 NOTE — Progress Notes (Signed)
I agree with the nurse's note. Fay Hogan 4:28 PM 04/15/17   

## 2017-04-15 NOTE — Progress Notes (Signed)
Patient here for stat bhcg today. Patient reports all over lower abdominal cramping, some improvement since yesterday. Patient was treated for ectopic pregnancy with MTX, these are day #4 labs. Discussed with patient waiting in lobby for results & updated plan of care. Patient verbalized understanding to all & has no questions at this time.  Reviewed results with Thressa ShellerHeather Hogan who finds decreasing bhcg levels favorable. Patient should return Thursday 1/10 for day #7 labs. Informed patient of results & updated plan of care. Patient verbalized understanding and had no questions

## 2017-04-18 ENCOUNTER — Other Ambulatory Visit: Payer: Self-pay | Admitting: General Practice

## 2017-04-18 ENCOUNTER — Other Ambulatory Visit: Payer: Federal, State, Local not specified - PPO | Admitting: General Practice

## 2017-04-18 DIAGNOSIS — O00101 Right tubal pregnancy without intrauterine pregnancy: Secondary | ICD-10-CM

## 2017-04-18 LAB — HCG, QUANTITATIVE, PREGNANCY: hCG, Beta Chain, Quant, S: 68 m[IU]/mL — ABNORMAL HIGH (ref <5)

## 2017-04-18 NOTE — Progress Notes (Addendum)
Patient is here for stat bhcg day #7 labs following MTX. Patient denies pain or bleeding. Discussed with patient, waiting in lobby for results & updated plan of care. Patient verbalized understanding to all & had no questions at this time.  Reviewed with Dr Erin FullingHarraway Smith, who finds sufficient decrease in bhcg levels- patient should have follow up bhcg in 2 weeks.   Informed patient of results & recommendation. Patient verbalized understanding to all & had no questions  Attestation of Attending Supervision of RN: Evaluation and management procedures were performed by the nurse under my supervision and collaboration.  I have reviewed the nursing note and chart, and I agree with the management and plan.  Carolyn L. Harraway-Smith, M.D., Evern CoreFACOG

## 2017-05-01 ENCOUNTER — Other Ambulatory Visit: Payer: Self-pay

## 2017-05-01 DIAGNOSIS — O26899 Other specified pregnancy related conditions, unspecified trimester: Secondary | ICD-10-CM

## 2017-05-01 DIAGNOSIS — O008 Other ectopic pregnancy without intrauterine pregnancy: Secondary | ICD-10-CM

## 2017-05-01 DIAGNOSIS — R109 Unspecified abdominal pain: Secondary | ICD-10-CM

## 2017-05-02 ENCOUNTER — Other Ambulatory Visit: Payer: Federal, State, Local not specified - PPO

## 2017-05-03 ENCOUNTER — Other Ambulatory Visit: Payer: Federal, State, Local not specified - PPO

## 2017-05-03 ENCOUNTER — Other Ambulatory Visit: Payer: Self-pay | Admitting: General Practice

## 2017-05-03 DIAGNOSIS — O00109 Unspecified tubal pregnancy without intrauterine pregnancy: Secondary | ICD-10-CM

## 2017-05-04 LAB — BETA HCG QUANT (REF LAB): hCG Quant: <1 m[IU]/mL

## 2017-05-08 ENCOUNTER — Telehealth: Payer: Self-pay | Admitting: General Practice

## 2017-05-08 NOTE — Telephone Encounter (Signed)
Patient called and left message on nurse line stating she is calling for test results and to let us know she is still having RLQ pain. Called patient, no answer- left message on voicemail stating we are trying to reach you to return your phone call. I will send you a mychart message in response to your voicemail

## 2018-03-21 ENCOUNTER — Encounter (HOSPITAL_COMMUNITY): Payer: Self-pay

## 2019-03-24 ENCOUNTER — Emergency Department (HOSPITAL_BASED_OUTPATIENT_CLINIC_OR_DEPARTMENT_OTHER): Payer: Federal, State, Local not specified - PPO

## 2019-03-24 ENCOUNTER — Encounter (HOSPITAL_BASED_OUTPATIENT_CLINIC_OR_DEPARTMENT_OTHER): Payer: Self-pay | Admitting: Emergency Medicine

## 2019-03-24 ENCOUNTER — Emergency Department (HOSPITAL_BASED_OUTPATIENT_CLINIC_OR_DEPARTMENT_OTHER)
Admission: EM | Admit: 2019-03-24 | Discharge: 2019-03-25 | Disposition: A | Discharge status: Home / Self Care | Payer: Federal, State, Local not specified - PPO | Attending: Emergency Medicine | Admitting: Emergency Medicine

## 2019-03-24 ENCOUNTER — Other Ambulatory Visit: Payer: Self-pay

## 2019-03-24 DIAGNOSIS — Z881 Allergy status to other antibiotic agents status: Secondary | ICD-10-CM | POA: Insufficient documentation

## 2019-03-24 DIAGNOSIS — Z885 Allergy status to narcotic agent status: Secondary | ICD-10-CM | POA: Diagnosis not present

## 2019-03-24 DIAGNOSIS — R002 Palpitations: Secondary | ICD-10-CM | POA: Diagnosis not present

## 2019-03-24 DIAGNOSIS — R072 Precordial pain: Secondary | ICD-10-CM | POA: Insufficient documentation

## 2019-03-24 LAB — CBC WITH DIFFERENTIAL/PLATELET
Abs Immature Granulocytes: 0.03 10*3/uL (ref 0.00–0.07)
Basophils Absolute: 0.1 10*3/uL (ref 0.0–0.1)
Basophils Relative: 1 %
Eosinophils Absolute: 0.3 10*3/uL (ref 0.0–0.5)
Eosinophils Relative: 2 %
HCT: 42.3 % (ref 36.0–46.0)
Hemoglobin: 14.3 g/dL (ref 12.0–15.0)
Immature Granulocytes: 0 %
Lymphocytes Relative: 24 %
Lymphs Abs: 2.5 10*3/uL (ref 0.7–4.0)
MCH: 29.8 pg (ref 26.0–34.0)
MCHC: 33.8 g/dL (ref 30.0–36.0)
MCV: 88.1 fL (ref 80.0–100.0)
Monocytes Absolute: 0.7 10*3/uL (ref 0.1–1.0)
Monocytes Relative: 7 %
Neutro Abs: 6.8 10*3/uL (ref 1.7–7.7)
Neutrophils Relative %: 66 %
Platelets: 328 10*3/uL (ref 150–400)
RBC: 4.8 MIL/uL (ref 3.87–5.11)
RDW: 13 % (ref 11.5–15.5)
WBC: 10.2 10*3/uL (ref 4.0–10.5)
nRBC: 0 % (ref 0.0–0.2)

## 2019-03-24 LAB — BASIC METABOLIC PANEL
Anion gap: 9 (ref 5–15)
BUN: 15 mg/dL (ref 6–20)
CO2: 26 mmol/L (ref 22–32)
Calcium: 9 mg/dL (ref 8.9–10.3)
Chloride: 103 mmol/L (ref 98–111)
Creatinine, Ser: 0.88 mg/dL (ref 0.44–1.00)
GFR calc Af Amer: >60 mL/min (ref >60)
GFR calc non Af Amer: >60 mL/min (ref >60)
Glucose, Bld: 123 mg/dL — ABNORMAL HIGH (ref 70–99)
Potassium: 3.2 mmol/L — ABNORMAL LOW (ref 3.5–5.1)
Sodium: 138 mmol/L (ref 135–145)

## 2019-03-24 LAB — D-DIMER, QUANTITATIVE: D-Dimer, Quant: 0.37 ug/mL-FEU (ref 0.00–0.50)

## 2019-03-24 LAB — TROPONIN I (HIGH SENSITIVITY): Troponin I (High Sensitivity): <2 ng/L (ref <18)

## 2019-03-24 MED ORDER — NITROGLYCERIN 0.4 MG SL SUBL
0.4000 mg | SUBLINGUAL_TABLET | SUBLINGUAL | Status: DC | PRN
  Administered 2019-03-24: 23:00:00 0.4 mg via SUBLINGUAL
  Filled 2019-03-24: qty 1

## 2019-03-24 MED ORDER — ASPIRIN 81 MG PO CHEW
324.0000 mg | CHEWABLE_TABLET | Freq: Once | ORAL | Status: AC
  Administered 2019-03-24: 324 mg via ORAL
  Filled 2019-03-24: qty 4

## 2019-03-24 NOTE — ED Provider Notes (Signed)
[facility] EMERGENCY DEPARTMENT Provider Note   CSN: [id number] Arrival date & time: 03/24/19  2145     History Chief Complaint  Patient presents with  . Palpitations    Melanie [name] is a 43 y.o. female.  Patient shortly prior to arrival had an episode of rapid heart rate.  According to her large rate was around 150.  Lasted for about 10 minutes and slowed down to about 100 but still felt a little irregular.  On the way here the palpitations and the irregular heart rate seem to resolve.  But then patient started to have substernal chest pain that radiated to the left side of the neck.  Melanie [name] described it as a pressure about 4 out of 10.  That is still present.  Patient is never had anything like that before.  Patient is a non-smoker not known to have diabetes or hypertension.  However there is a significant family history where her father at age 41 had a cardiac stent placed.  Patient does not currently have a primary care doctor past medical history is only significant for migraines.  Never had any chest pain in the past.  Patient's had no fevers no shortness of breath nor upper respiratory symptoms.  No nausea or vomiting.  Patient states that besides it radiating up to the left side of the neck it may have gone to the left side of the back a little bit as well.        Past Medical History:  Diagnosis Date  . Migraines     Patient Active Problem List   Diagnosis Date Noted  . Ectopic pregnancy 04/14/2017  . Abdominal pain affecting pregnancy 04/14/2017  . ACUTE MAXILLARY SINUSITIS 06/11/2009  . URI 06/11/2009    Past Surgical History:  Procedure Laterality Date  . APPENDECTOMY    . TUBAL LIGATION       OB History    Gravida  3   Para  2   Term  2   Preterm      AB      Living  2     SAB      TAB      Ectopic      Multiple      Live Births  2           Family History  Problem Relation Age of Onset  . Hypertension Mother   .  Diabetes Mother   . Osteoarthritis Mother   . Coronary artery disease Father     Social History   Tobacco Use  . Smoking status: Never Smoker  . Smokeless tobacco: Never Used  Substance Use Topics  . Alcohol use: Yes    Comment: social  . Drug use: No    Home Medications Prior to Admission medications   Medication Sig Start Date End Date Taking? Authorizing Provider  ibuprofen (ADVIL,MOTRIN) 200 MG tablet Take 600 mg by mouth every 6 (six) hours as needed for headache.    [provider]  nitrofurantoin, macrocrystal-monohydrate, (MACROBID) 100 MG capsule Take 1 capsule (100 mg total) by mouth 2 (two) times daily. 04/13/17   Marny LowensteinWenzel, Julie N, PA-C  oxyCODONE-acetaminophen (ROXICET) 5-325 MG tablet Take 1 tablet by mouth every 6 (six) hours as needed for severe pain. 04/14/17   Raelyn Moraawson, Rolitta, CNM  promethazine (PHENERGAN) 25 MG tablet Take 25 mg by mouth every 6 (six) hours as needed for nausea or vomiting.    [provider]  SUMAtriptan (  IMITREX) 50 MG tablet Take 50 mg by mouth every 2 (two) hours as needed for migraine. May repeat in 2 hours if headache persists or recurs.    [provider]    Allergies    Cephalexin and Codeine  Review of Systems   Review of Systems  Constitutional: Negative for chills and fever.  HENT: Negative for congestion, rhinorrhea and sore throat.   Eyes: Negative for visual disturbance.  Respiratory: Negative for cough and shortness of breath.   Cardiovascular: Positive for chest pain and palpitations. Negative for leg swelling.  Gastrointestinal: Negative for abdominal pain, diarrhea, nausea and vomiting.  Genitourinary: Negative for dysuria.  Musculoskeletal: Negative for back pain and neck pain.  Skin: Negative for rash.  Neurological: Negative for dizziness, light-headedness and headaches.  Hematological: Does not bruise/bleed easily.  Psychiatric/Behavioral: Negative for confusion.    Physical Exam Updated Vital  Signs BP 111/80   Pulse 81   Resp 16   Ht 1.778 m (5\' 10" )   Wt 76.2 kg   SpO2 100%   BMI 24.11 kg/m   Physical Exam Vitals and nursing note reviewed.  Constitutional:      General: Melanie [name] is not in acute distress.    Appearance: Melanie [name] is well-developed.  HENT:     Head: Normocephalic and atraumatic.  Eyes:     Extraocular Movements: Extraocular movements intact.     Conjunctiva/sclera: Conjunctivae normal.     Pupils: Pupils are equal, round, and reactive to light.  Cardiovascular:     Rate and Rhythm: Normal rate and regular rhythm.     Heart sounds: No murmur.  Pulmonary:     Effort: Pulmonary effort is normal. No respiratory distress.     Breath sounds: Normal breath sounds.  Chest:     Chest wall: No tenderness.  Abdominal:     Palpations: Abdomen is soft.     Tenderness: There is no abdominal tenderness.  Musculoskeletal:        General: No swelling.     Cervical back: Normal range of motion and neck supple.  Skin:    General: Skin is warm and dry.     Capillary Refill: Capillary refill takes less than 2 seconds.  Neurological:     General: No focal deficit present.     Mental Status: Melanie [name] is alert and oriented to person, place, and time.     ED Results / Procedures / Treatments   Labs (all labs ordered are listed, but only abnormal results are displayed) Labs Reviewed  BASIC METABOLIC PANEL - Abnormal; Notable for the following components:      Result Value   Potassium 3.2 (*)    Glucose, Bld 123 (*)    All other components within normal limits  CBC WITH DIFFERENTIAL/PLATELET  TROPONIN I (HIGH SENSITIVITY)    EKG EKG Interpretation  Date/Time:  Tuesday March 24 2019 22:00:47 EST Ventricular Rate:  93 PR Interval:    QRS Duration: 90 QT Interval:  353 QTC Calculation: 439 R Axis:   42 Text Interpretation: Sinus rhythm No previous ECGs available Confirmed by Fredia Sorrow [phone]) on 03/24/2019 10:07:03 PM   Radiology No results  found.  Procedures Procedures (including critical care time)  Medications Ordered in ED Medications  nitroGLYCERIN (NITROSTAT) SL tablet 0.4 mg (0.4 mg Sublingual Given 03/24/19 2241)  aspirin chewable tablet 324 mg (324 mg Oral Given 03/24/19 2240)    ED Course  I have reviewed the triage vital signs and the nursing notes.  Pertinent labs & imaging results that were available during my care of the patient were reviewed by me and considered in my medical decision making (see chart for details).    MDM Rules/Calculators/A&P                      Patient with new onset of palpitations.  EKG here sinus rhythm cardiac monitoring has been sinus rhythm.  No evidence of any palpitations or arrhythmias during monitoring here.  However the chest discomfort that started on the way here after the palpitation feeling had stopped was still present.  Patient stated it was about a 4 out of 10.  Patient was given aspirin nitroglycerin and it resolved.  Patient still has some slight discomfort in the substernal area.  So has not gone away completely.  But at one point it did go away completely.  Patient has not had any feeling of the palpitations.  Patient's initial labs without any significant abnormality other than some mild hypokalemia.  Chest x-ray is pending.  Troponins x2 are pending.  Patient's oxygen saturations were 100% upon arrival.  Clinically feel that PE unlikely.  But not completely ruled out.  So will check a D-dimer.  Not concerned about Covid infection.  Patient will be turned over to the overnight emergency physician to follow-up on the D-dimer and the troponins.    Final Clinical Impression(s) / ED Diagnoses Final diagnoses:  Palpitations  Precordial pain    Rx / DC Orders ED Discharge Orders    None       Vanetta Mulders, MD 03/24/19 2327

## 2019-03-24 NOTE — ED Triage Notes (Signed)
Pt states she was sitting on the couch with her children and started having heart palpitations  Pt states she took a few deep breaths but it did not help  Pt states then she started having some discomfort on the left side and up into her neck

## 2019-03-24 NOTE — ED Notes (Signed)
Pt stated some relief with 1st nitro, does not want to take 2nd at this time. BP rechecked and call light within reach.

## 2019-03-25 LAB — TROPONIN I (HIGH SENSITIVITY): Troponin I (High Sensitivity): <2 ng/L (ref <18)

## 2020-04-04 ENCOUNTER — Emergency Department (HOSPITAL_BASED_OUTPATIENT_CLINIC_OR_DEPARTMENT_OTHER): Payer: Federal, State, Local not specified - PPO

## 2020-04-04 ENCOUNTER — Other Ambulatory Visit: Payer: Self-pay

## 2020-04-04 ENCOUNTER — Encounter (HOSPITAL_BASED_OUTPATIENT_CLINIC_OR_DEPARTMENT_OTHER): Payer: Self-pay | Admitting: *Deleted

## 2020-04-04 ENCOUNTER — Emergency Department (HOSPITAL_BASED_OUTPATIENT_CLINIC_OR_DEPARTMENT_OTHER)
Admission: EM | Admit: 2020-04-04 | Discharge: 2020-04-04 | Disposition: A | Discharge status: Home / Self Care | Payer: Federal, State, Local not specified - PPO | Attending: Emergency Medicine | Admitting: Emergency Medicine

## 2020-04-04 DIAGNOSIS — R1030 Lower abdominal pain, unspecified: Secondary | ICD-10-CM

## 2020-04-04 DIAGNOSIS — R1032 Left lower quadrant pain: Secondary | ICD-10-CM | POA: Diagnosis present

## 2020-04-04 DIAGNOSIS — N83202 Unspecified ovarian cyst, left side: Secondary | ICD-10-CM | POA: Diagnosis not present

## 2020-04-04 LAB — COMPREHENSIVE METABOLIC PANEL
ALT: 15 U/L (ref 0–44)
AST: 17 U/L (ref 15–41)
Albumin: 4.5 g/dL (ref 3.5–5.0)
Alkaline Phosphatase: 36 U/L — ABNORMAL LOW (ref 38–126)
Anion gap: 12 (ref 5–15)
BUN: 9 mg/dL (ref 6–20)
CO2: 24 mmol/L (ref 22–32)
Calcium: 9.5 mg/dL (ref 8.9–10.3)
Chloride: 102 mmol/L (ref 98–111)
Creatinine, Ser: 0.89 mg/dL (ref 0.44–1.00)
GFR, Estimated: >60 mL/min (ref >60)
Glucose, Bld: 89 mg/dL (ref 70–99)
Potassium: 3.5 mmol/L (ref 3.5–5.1)
Sodium: 138 mmol/L (ref 135–145)
Total Bilirubin: 0.4 mg/dL (ref 0.3–1.2)
Total Protein: 7.5 g/dL (ref 6.5–8.1)

## 2020-04-04 LAB — CBC WITH DIFFERENTIAL/PLATELET
Abs Immature Granulocytes: 0.02 10*3/uL (ref 0.00–0.07)
Basophils Absolute: 0.1 10*3/uL (ref 0.0–0.1)
Basophils Relative: 1 %
Eosinophils Absolute: 0.1 10*3/uL (ref 0.0–0.5)
Eosinophils Relative: 1 %
HCT: 40.8 % (ref 36.0–46.0)
Hemoglobin: 14.2 g/dL (ref 12.0–15.0)
Immature Granulocytes: 0 %
Lymphocytes Relative: 21 %
Lymphs Abs: 1.8 10*3/uL (ref 0.7–4.0)
MCH: 30.1 pg (ref 26.0–34.0)
MCHC: 34.8 g/dL (ref 30.0–36.0)
MCV: 86.6 fL (ref 80.0–100.0)
Monocytes Absolute: 0.5 10*3/uL (ref 0.1–1.0)
Monocytes Relative: 6 %
Neutro Abs: 6 10*3/uL (ref 1.7–7.7)
Neutrophils Relative %: 71 %
Platelets: 339 10*3/uL (ref 150–400)
RBC: 4.71 MIL/uL (ref 3.87–5.11)
RDW: 12.8 % (ref 11.5–15.5)
WBC: 8.4 10*3/uL (ref 4.0–10.5)
nRBC: 0 % (ref 0.0–0.2)

## 2020-04-04 LAB — URINALYSIS, ROUTINE W REFLEX MICROSCOPIC
Bilirubin Urine: NEGATIVE
Glucose, UA: NEGATIVE mg/dL
Hgb urine dipstick: NEGATIVE
Ketones, ur: NEGATIVE mg/dL
Leukocytes,Ua: NEGATIVE
Nitrite: NEGATIVE
Protein, ur: NEGATIVE mg/dL
Specific Gravity, Urine: 1.01 (ref 1.005–1.030)
pH: 6 (ref 5.0–8.0)

## 2020-04-04 LAB — PREGNANCY, URINE: Preg Test, Ur: NEGATIVE

## 2020-04-04 MED ORDER — KETOROLAC TROMETHAMINE 30 MG/ML IJ SOLN
30.0000 mg | Freq: Once | INTRAMUSCULAR | Status: AC
  Administered 2020-04-04: 22:00:00 30 mg via INTRAVENOUS
  Filled 2020-04-04: qty 1

## 2020-04-04 MED ORDER — SODIUM CHLORIDE 0.9 % IV BOLUS
1000.0000 mL | Freq: Once | INTRAVENOUS | Status: AC
  Administered 2020-04-04: 22:00:00 1000 mL via INTRAVENOUS

## 2020-04-04 MED ORDER — IOHEXOL 300 MG/ML  SOLN
100.0000 mL | Freq: Once | INTRAMUSCULAR | Status: AC | PRN
  Administered 2020-04-04: 22:00:00 100 mL via INTRAVENOUS

## 2020-04-04 NOTE — Discharge Instructions (Addendum)
Follow-up with your doctor tomorrow as planned to discuss pelvic ultrasound.  Return to ER for worsening or concerning symptoms.

## 2020-04-04 NOTE — ED Provider Notes (Signed)
[facility] EMERGENCY DEPARTMENT Provider Note   CSN: [id number] Arrival date & time: 04/04/20  1312     History Chief Complaint  Patient presents with  . Vaginal Pain    Melanie [name] is a 43 y.o. female.  44 year old female with past medical history of migraines, hemorrhagic ovarian cyst, ectopic pregnancy presents with complaint of left lower quadrant abdominal pain onset this morning.  Also reports having some right lower quadrant discomfort, LMP 3 weeks ago, concern for ovarian cysts.  Patient is not on birth control, has had tubal ligation, is in a monogamous relationship with her husband and denies concerns for STDs today. Additional abdominal surgical history includes appendectomy.         Past Medical History:  Diagnosis Date  . Migraines     Patient Active Problem List   Diagnosis Date Noted  . Ectopic pregnancy 04/14/2017  . Abdominal pain affecting pregnancy 04/14/2017  . ACUTE MAXILLARY SINUSITIS 06/11/2009  . URI 06/11/2009    Past Surgical History:  Procedure Laterality Date  . APPENDECTOMY    . TUBAL LIGATION       OB History    Gravida  3   Para  2   Term  2   Preterm      AB      Living  2     SAB      IAB      Ectopic      Multiple      Live Births  2           Family History  Problem Relation Age of Onset  . Hypertension Mother   . Diabetes Mother   . Osteoarthritis Mother   . Coronary artery disease Father     Social History   Tobacco Use  . Smoking status: Never Smoker  . Smokeless tobacco: Never Used  Substance Use Topics  . Alcohol use: Yes    Comment: social  . Drug use: No    Home Medications Prior to Admission medications   Medication Sig Start Date End Date Taking? Authorizing Provider  lisdexamfetamine (VYVANSE) 40 MG capsule Take by mouth. 03/28/20 04/27/20 Yes [provider]  ibuprofen (ADVIL,MOTRIN) 200 MG tablet Take 600 mg by mouth every 6 (six) hours as needed for  headache.    [provider]  nitrofurantoin, macrocrystal-monohydrate, (MACROBID) 100 MG capsule Take 1 capsule (100 mg total) by mouth 2 (two) times daily. 04/13/17   Marny Lowenstein, PA-C  oxyCODONE-acetaminophen (ROXICET) 5-325 MG tablet Take 1 tablet by mouth every 6 (six) hours as needed for severe pain. 04/14/17   Raelyn Mora, CNM  promethazine (PHENERGAN) 25 MG tablet Take 25 mg by mouth every 6 (six) hours as needed for nausea or vomiting.    [provider]  SUMAtriptan (IMITREX) 50 MG tablet Take 50 mg by mouth every 2 (two) hours as needed for migraine. May repeat in 2 hours if headache persists or recurs.    [provider]    Allergies    Cephalexin and Codeine  Review of Systems   Review of Systems  Constitutional: Negative for fever.  Gastrointestinal: Positive for abdominal pain. Negative for constipation, diarrhea, nausea and vomiting.  Genitourinary: Negative for dysuria, frequency, vaginal bleeding and vaginal discharge.  Musculoskeletal: Negative for back pain.  Allergic/Immunologic: Negative for immunocompromised state.  Neurological: Negative for weakness.  All other systems reviewed and are negative.   Physical Exam Updated Vital Signs BP 120/83 (BP  Location: Left Arm)   Pulse 71   Temp 97.8 F (36.6 C) (Oral)   Resp 16   Ht 5\' 10"  (1.778 m)   Wt 80 kg   LMP 03/14/2020   SpO2 100%   BMI 25.31 kg/m   Physical Exam Vitals and nursing note reviewed.  Constitutional:      General: She is not in acute distress.    Appearance: She is well-developed and well-nourished. She is not diaphoretic.  HENT:     Head: Normocephalic and atraumatic.  Cardiovascular:     Rate and Rhythm: Normal rate and regular rhythm.     Heart sounds: Normal heart sounds.  Pulmonary:     Effort: Pulmonary effort is normal.     Breath sounds: Normal breath sounds.  Abdominal:     Palpations: Abdomen is soft.     Tenderness: There is abdominal  tenderness in the right lower quadrant and left lower quadrant. There is no right CVA tenderness or left CVA tenderness.  Skin:    General: Skin is warm and dry.     Findings: No erythema or rash.  Neurological:     Mental Status: She is alert and oriented to person, place, and time.  Psychiatric:        Mood and Affect: Mood and affect normal.        Behavior: Behavior normal.     ED Results / Procedures / Treatments   Labs (all labs ordered are listed, but only abnormal results are displayed) Labs Reviewed  COMPREHENSIVE METABOLIC PANEL - Abnormal; Notable for the following components:      Result Value   Alkaline Phosphatase 36 (*)    All other components within normal limits  PREGNANCY, URINE  URINALYSIS, ROUTINE W REFLEX MICROSCOPIC  CBC WITH DIFFERENTIAL/PLATELET    EKG None  Radiology CT Abdomen Pelvis W Contrast  Result Date: 04/04/2020 CLINICAL DATA:  Abdominal and vaginal pain EXAM: CT ABDOMEN AND PELVIS WITH CONTRAST TECHNIQUE: Multidetector CT imaging of the abdomen and pelvis was performed using the standard protocol following bolus administration of intravenous contrast. CONTRAST:  04/06/2020 OMNIPAQUE IOHEXOL 300 MG/ML  SOLN COMPARISON:  None. FINDINGS: Lower chest: The visualized heart size within normal limits. No pericardial fluid/thickening. No hiatal hernia. The visualized portions of the lungs are clear. Hepatobiliary: The liver is normal in density without focal abnormality.The main portal vein is patent. No evidence of calcified gallstones, gallbladder wall thickening or biliary dilatation. Pancreas: Unremarkable. No pancreatic ductal dilatation or surrounding inflammatory changes. Spleen: Normal in size without focal abnormality. Adrenals/Urinary Tract: Both adrenal glands appear normal. The kidneys and collecting system appear normal without evidence of urinary tract calculus or hydronephrosis. Bladder is unremarkable. Stomach/Bowel: The stomach, small bowel, and  colon are normal in appearance. No inflammatory changes, wall thickening, or obstructive findings.Scattered colonic diverticula are noted. The patient is status post appendectomy. Vascular/Lymphatic: There are no enlarged mesenteric, retroperitoneal, or pelvic lymph nodes. No significant vascular findings are present. Reproductive: Within the left adnexa there is a probable small corpus luteum cyst. The uterus is grossly unremarkable. Other: No evidence of abdominal wall mass or hernia. Musculoskeletal: No acute or significant osseous findings. IMPRESSION: Scattered colonic diverticula without definite diverticulitis. Probable left corpus luteum cyst. If patient's pain persists, would recommend dedicated pelvic ultrasound for further evaluation. Electronically Signed   By: M.D.   On: 04/04/2020 21:48    Procedures Procedures (including critical care time)  Medications Ordered in ED Medications  ketorolac (TORADOL)  30 MG/ML injection 30 mg (has no administration in time range)  sodium chloride 0.9 % bolus 1,000 mL (1,000 mLs Intravenous New Bag/Given 04/04/20 2151)  iohexol (OMNIPAQUE) 300 MG/ML solution 100 mL (100 mLs Intravenous Contrast Given 04/04/20 2133)    ED Course  I have reviewed the triage vital signs and the nursing notes.  Pertinent labs & imaging results that were available during my care of the patient were reviewed by me and considered in my medical decision making (see chart for details).  Clinical Course as of 04/04/20 2205  Mon Apr 04, 2020  [id number] 44 year old female with complaint of LLQ pain onset today, history of large hemorrhagic cyst and ectopic pregnancy (treated with methotrexate), also tubaligation.  Found to have right and left lower abdominal pain on exam. Plan was for pelvic ultrasound however [place] is not available at this time. Discussed with patient, offered transfer to another facility for [place], CT, return in the morning for [place]. Patient prefers to make  imaging decision after labs complete. Labs return with normal limits (CBC, CMP, UA, negative upreg). Patient would like to proceed with CT. Declines narcotic pain medications as she is driving today, agrees with Toradol after CT complete.  [LM]  2135 CT abdomen pelvis with probable left corpus luteum cyst, recommend pelvic ultrasound if pain persists.  Diverticulosis without diverticulitis.  Discussed results with patient, ultrasound is not available at this time abdomen facility, patient plans to contact her PCP tomorrow to discuss ordering pelvic ultrasound.  If for some reason she is unable to get in contact with her PCP, she may have her pelvic ultrasound ordered here. [LM]    Clinical Course User Index [LM] Alden Hipp   MDM Rules/Calculators/A&P                          Final Clinical Impression(s) / ED Diagnoses Final diagnoses:  Lower abdominal pain  Cyst of left ovary    Rx / DC Orders ED Discharge Orders         Ordered    US PELVIC COMPLETE W TRANSVAGINAL AND TORSION R/O        04/04/20 2205           Jeannie Fend, PA-C 04/04/20 2205    Terald Sleeper, MD 04/05/20 [phone]

## 2020-04-04 NOTE — ED Notes (Signed)
Pt aware that ultrasound is not available tonight.  Waiting on labs to decide what she wants to do.  Has had discussion with the PA

## 2020-04-04 NOTE — ED Triage Notes (Signed)
Vaginal pain. Hx of ectopic with same type of pain. She has not had a home pregnancy test.

## 2021-08-10 ENCOUNTER — Encounter (HOSPITAL_BASED_OUTPATIENT_CLINIC_OR_DEPARTMENT_OTHER): Payer: Self-pay

## 2021-08-10 ENCOUNTER — Other Ambulatory Visit: Payer: Self-pay

## 2021-08-10 ENCOUNTER — Emergency Department (HOSPITAL_BASED_OUTPATIENT_CLINIC_OR_DEPARTMENT_OTHER)
Admission: EM | Admit: 2021-08-10 | Discharge: 2021-08-10 | Disposition: A | Discharge status: Home / Self Care | Payer: Federal, State, Local not specified - PPO | Attending: Emergency Medicine | Admitting: Emergency Medicine

## 2021-08-10 DIAGNOSIS — R519 Headache, unspecified: Secondary | ICD-10-CM | POA: Diagnosis not present

## 2021-08-10 DIAGNOSIS — R42 Dizziness and giddiness: Secondary | ICD-10-CM | POA: Insufficient documentation

## 2021-08-10 LAB — BASIC METABOLIC PANEL
Anion gap: 8 (ref 5–15)
BUN: 9 mg/dL (ref 6–20)
CO2: 25 mmol/L (ref 22–32)
Calcium: 8.4 mg/dL — ABNORMAL LOW (ref 8.9–10.3)
Chloride: 96 mmol/L — ABNORMAL LOW (ref 98–111)
Creatinine, Ser: 0.87 mg/dL (ref 0.44–1.00)
GFR, Estimated: >60 mL/min (ref >60)
Glucose, Bld: 109 mg/dL — ABNORMAL HIGH (ref 70–99)
Potassium: 3.6 mmol/L (ref 3.5–5.1)
Sodium: 129 mmol/L — ABNORMAL LOW (ref 135–145)

## 2021-08-10 LAB — URINALYSIS, MICROSCOPIC (REFLEX): RBC / HPF: >50 RBC/hpf (ref 0–5)

## 2021-08-10 LAB — CBC
HCT: 37.2 % (ref 36.0–46.0)
Hemoglobin: 13 g/dL (ref 12.0–15.0)
MCH: 30.1 pg (ref 26.0–34.0)
MCHC: 34.9 g/dL (ref 30.0–36.0)
MCV: 86.1 fL (ref 80.0–100.0)
Platelets: 299 10*3/uL (ref 150–400)
RBC: 4.32 MIL/uL (ref 3.87–5.11)
RDW: 12.3 % (ref 11.5–15.5)
WBC: 7.8 10*3/uL (ref 4.0–10.5)
nRBC: 0 % (ref 0.0–0.2)

## 2021-08-10 LAB — URINALYSIS, ROUTINE W REFLEX MICROSCOPIC
Bilirubin Urine: NEGATIVE
Glucose, UA: NEGATIVE mg/dL
Ketones, ur: NEGATIVE mg/dL
Nitrite: NEGATIVE
Protein, ur: NEGATIVE mg/dL
Specific Gravity, Urine: 1.01 (ref 1.005–1.030)
pH: 7 (ref 5.0–8.0)

## 2021-08-10 LAB — CBG MONITORING, ED: Glucose-Capillary: 82 mg/dL (ref 70–99)

## 2021-08-10 LAB — PREGNANCY, URINE: Preg Test, Ur: NEGATIVE

## 2021-08-10 MED ORDER — MECLIZINE HCL 25 MG PO TABS
25.0000 mg | ORAL_TABLET | Freq: Once | ORAL | Status: AC
  Administered 2021-08-10: 25 mg via ORAL
  Filled 2021-08-10: qty 1

## 2021-08-10 MED ORDER — DEXAMETHASONE 4 MG PO TABS
10.0000 mg | ORAL_TABLET | Freq: Once | ORAL | Status: AC
  Administered 2021-08-10: 10 mg via ORAL
  Filled 2021-08-10: qty 3

## 2021-08-10 MED ORDER — SODIUM CHLORIDE 0.9 % IV BOLUS
1000.0000 mL | Freq: Once | INTRAVENOUS | Status: AC
  Administered 2021-08-10: 1000 mL via INTRAVENOUS

## 2021-08-10 MED ORDER — DIPHENHYDRAMINE HCL 50 MG/ML IJ SOLN
25.0000 mg | Freq: Once | INTRAMUSCULAR | Status: AC
  Administered 2021-08-10: 25 mg via INTRAVENOUS
  Filled 2021-08-10: qty 1

## 2021-08-10 MED ORDER — PROCHLORPERAZINE EDISYLATE 10 MG/2ML IJ SOLN
10.0000 mg | Freq: Once | INTRAMUSCULAR | Status: AC
  Administered 2021-08-10: 10 mg via INTRAVENOUS
  Filled 2021-08-10: qty 2

## 2021-08-10 NOTE — Discharge Instructions (Addendum)
Your lab work was most consistent with dehydration.  Eat and drink as well as you can for the next 48 hours.  Please follow-up with your family doctor for lab recheck hopefully within a week to recheck your sodium.  Please return for worsening symptoms. ?

## 2021-08-10 NOTE — ED Triage Notes (Signed)
Dizzy x this am - progressively gotten worse through the day ?No neuro deficits noted. ?

## 2021-08-10 NOTE — ED Provider Notes (Signed)
?[facility] EMERGENCY DEPARTMENT ?Provider Note ? ? ?CSN: [id number] ?Arrival date & time: 08/10/21  1434 ? ?  ? ?History ? ?Chief Complaint  ?Patient presents with  ? Dizziness  ? ? ?Melanie [name] is a 46 y.o. female. ? ?46 yo F with a chief complaints of feeling dizzy.  She describes this as a feeling like she might pass out.  Is been going on all day today.  Nothing seems to make this better or worse.  She feels like she has been eating and drinking normally.  Denies nausea vomiting or diarrhea.  Has had a headache with this off and on today.  Took a BC powder this morning with some improvement but had recurrence recently.  Denies any neck pain denies head injury.  She denies one-sided numbness or weakness denies difficulty speech or swallowing.  She is not on any medicines at home.  Denies any new diet denies any over-the-counter supplements. ? ? ?Dizziness ? ?  ? ?Home Medications ?Prior to Admission medications   ?Medication Sig Start Date End Date Taking? Authorizing Provider  ?ibuprofen (ADVIL,MOTRIN) 200 MG tablet Take 600 mg by mouth every 6 (six) hours as needed for headache.    [provider]  ?lisdexamfetamine (VYVANSE) 40 MG capsule Take by mouth. 03/28/20 04/27/20  [provider]  ?nitrofurantoin, macrocrystal-monohydrate, (MACROBID) 100 MG capsule Take 1 capsule (100 mg total) by mouth 2 (two) times daily. 04/13/17   Marny Lowenstein, PA-C  ?oxyCODONE-acetaminophen (ROXICET) 5-325 MG tablet Take 1 tablet by mouth every 6 (six) hours as needed for severe pain. 04/14/17   Raelyn Mora, CNM  ?promethazine (PHENERGAN) 25 MG tablet Take 25 mg by mouth every 6 (six) hours as needed for nausea or vomiting.    [provider]  ?SUMAtriptan (IMITREX) 50 MG tablet Take 50 mg by mouth every 2 (two) hours as needed for migraine. May repeat in 2 hours if headache persists or recurs.    [provider]  ?   ? ?Allergies    ?Cephalexin and Codeine   ? ?Review of  Systems   ?Review of Systems  ?Neurological:  Positive for dizziness.  ? ?Physical Exam ?Updated Vital Signs ?BP (!) 138/94   Pulse 65   Temp 97.8 ?F (36.6 ?C)   Resp 11   Ht 5\' 10"  (1.778 m)   Wt 77.1 kg   LMP 08/09/2021   SpO2 99%   BMI 24.39 kg/m?  ?Physical Exam ?Vitals and nursing note reviewed.  ?Constitutional:   ?   General: She is not in acute distress. ?   Appearance: She is well-developed. She is not diaphoretic.  ?HENT:  ?   Head: Normocephalic and atraumatic.  ?Eyes:  ?   Pupils: Pupils are equal, round, and reactive to light.  ?Cardiovascular:  ?   Rate and Rhythm: Normal rate and regular rhythm.  ?   Heart sounds: No murmur heard. ?  No friction rub. No gallop.  ?Pulmonary:  ?   Effort: Pulmonary effort is normal.  ?   Breath sounds: No wheezing or rales.  ?Abdominal:  ?   General: There is no distension.  ?   Palpations: Abdomen is soft.  ?   Tenderness: There is no abdominal tenderness.  ?Musculoskeletal:     ?   General: No tenderness.  ?   Cervical back: Normal range of motion and neck supple.  ?Skin: ?   General: Skin is warm and dry.  ?Neurological:  ?  Mental Status: She is alert and oriented to person, place, and time.  ?   GCS: GCS eye subscore is 4. GCS verbal subscore is 5. GCS motor subscore is 6.  ?   Cranial Nerves: Cranial nerves 2-12 are intact.  ?   Sensory: Sensation is intact.  ?   Motor: Motor function is intact.  ?   Coordination: Coordination is intact.  ?   Comments: Ambulates without issue.  Benign neurologic exam.  She feels mildly nauseated with rapid eye movements but has no nystagmus.  Denies any dizziness with it.  She feels mildly worse with ambulation.  ?Psychiatric:     ?   Behavior: Behavior normal.  ? ? ?ED Results / Procedures / Treatments   ?Labs ?(all labs ordered are listed, but only abnormal results are displayed) ?Labs Reviewed  ?BASIC METABOLIC PANEL - Abnormal; Notable for the following components:  ?    Result Value  ? Sodium 129 (*)   ? Chloride 96  (*)   ? Glucose, Bld 109 (*)   ? Calcium 8.4 (*)   ? All other components within normal limits  ?URINALYSIS, ROUTINE W REFLEX MICROSCOPIC - Abnormal; Notable for the following components:  ? Hgb urine dipstick LARGE (*)   ? Leukocytes,Ua SMALL (*)   ? All other components within normal limits  ?URINALYSIS, MICROSCOPIC (REFLEX) - Abnormal; Notable for the following components:  ? Bacteria, UA FEW (*)   ? All other components within normal limits  ?CBC  ?PREGNANCY, URINE  ?CBG MONITORING, ED  ? ? ?EKG ?EKG Interpretation ? ?Date/Time:  Thursday Aug 10 2021 14:52:57 EDT ?Ventricular Rate:  77 ?PR Interval:  162 ?QRS Duration: 105 ?QT Interval:  376 ?QTC Calculation: 426 ?R Axis:   71 ?Text Interpretation: Sinus rhythm No significant change since last tracing Confirmed by Melene Plan [phone]) on 08/10/2021 3:25:28 PM ? ?Radiology ?No results found. ? ?Procedures ?Procedures  ? ? ?Medications Ordered in ED ?Medications  ?sodium chloride 0.9 % bolus 1,000 mL (1,000 mLs Intravenous New Bag/Given 08/10/21 1521)  ?prochlorperazine (COMPAZINE) injection 10 mg (10 mg Intravenous Given 08/10/21 1525)  ?diphenhydrAMINE (BENADRYL) injection 25 mg (25 mg Intravenous Given 08/10/21 1524)  ?dexamethasone (DECADRON) tablet 10 mg (10 mg Oral Given 08/10/21 1522)  ?meclizine (ANTIVERT) tablet 25 mg (25 mg Oral Given 08/10/21 1522)  ? ? ?ED Course/ Medical Decision Making/ A&P ?  ?                        ?Medical Decision Making ?Amount and/or Complexity of Data Reviewed ?Labs: ordered. ? ?Risk ?Prescription drug management. ? ? ?46 yo F with a chief complaint of dizziness.  She describes it more as a sensation like she is lightheaded and might pass out.  She denies almost every other question review of systems.  She does tell me that she started her menstrual cycle and its little bit heavier than normal has had a history of ovarian hemorrhage as well has ectopic pregnancy.  Denies likelihood of being pregnant. ? ?She also is describing some  tinnitus.  Could be M?ni?re's.  Never had a prior like this before.  She also has a headache with this we will treat with a headache cocktail for complicated migraine.  Bolus of IV fluids.  Blood work.  Reassess. ? ?Patient's UA is negative for infection, pregnancy test negative.  No anemia.  No leukocytosis. ? ?Patient's metabolic panel most consistent with dehydration with hyponatremia and  hypochloremia.  She is feeling a bit better after headache cocktail and IV fluids.  I [facility].  At this point she is declining.  We will eat and drink as well as give her next 48 hours.  PCP follow-up. ? ?4:14 PM:  I have discussed the diagnosis/risks/treatment options with the patient.  Evaluation and diagnostic testing in the emergency department does not suggest an emergent condition requiring admission or immediate intervention beyond what has been performed at this time.  They will follow up with  PCP. We also discussed returning to the ED immediately if new or worsening sx occur. We discussed the sx which are most concerning (e.g., sudden worsening pain, fever, inability to tolerate by mouth) that necessitate immediate return. Medications administered to the patient during their visit and any new prescriptions provided to the patient are listed below. ? ?Medications given during this visit ?Medications  ?sodium chloride 0.9 % bolus 1,000 mL (1,000 mLs Intravenous New Bag/Given 08/10/21 1521)  ?prochlorperazine (COMPAZINE) injection 10 mg (10 mg Intravenous Given 08/10/21 1525)  ?diphenhydrAMINE (BENADRYL) injection 25 mg (25 mg Intravenous Given 08/10/21 1524)  ?dexamethasone (DECADRON) tablet 10 mg (10 mg Oral Given 08/10/21 1522)  ?meclizine (ANTIVERT) tablet 25 mg (25 mg Oral Given 08/10/21 1522)  ? ? ? ?The patient appears reasonably screen and/or stabilized for discharge and I doubt any other medical condition or other [facility] requiring further screening, evaluation, or treatment  in the ED at this time prior to discharge.  ? ? ? ? ? ? ? ? ?Final Clinical Impression(s) / ED Diagnoses ?Final diagnoses:  ?Dizziness  ? ? ?Rx / DC Orders ?ED Discharge Orders   ? ? None  ? ?  ? ? ?  ?Melene PlanFloyd, Carlyn Lemke, DO ?05

## 2022-08-07 IMAGING — CT CT ABD-PELV W/ CM
2 of 5 series · 16 of 46 positions shown, 18 images · IV contrast (Omnipaque)
Comparison: None.

CLINICAL DATA: Abdominal and vaginal pain

EXAM:
CT ABDOMEN AND PELVIS WITH CONTRAST
TECHNIQUE: Multidetector CT imaging of the abdomen and pelvis was performed
using the standard protocol following bolus administration of
intravenous contrast.
CONTRAST:  100mL OMNIPAQUE IOHEXOL 300 MG/ML  SOLN

[Series 2: axial st · axial · 0.74mm/px · z∈[-530,-130]mm · 13 of 90 slices shown, 15 images]
[im 5/90  soft-tissue]
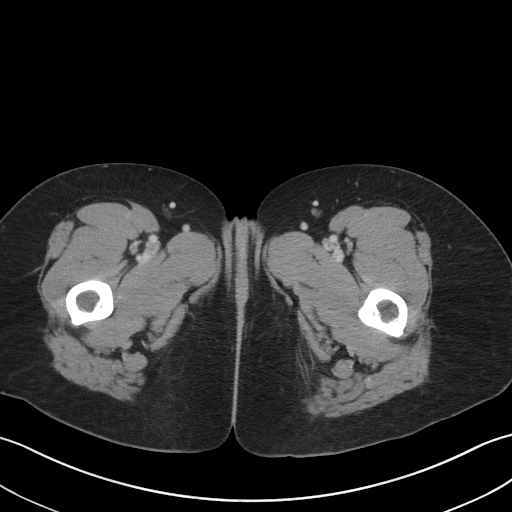
[im 5/90  bone]
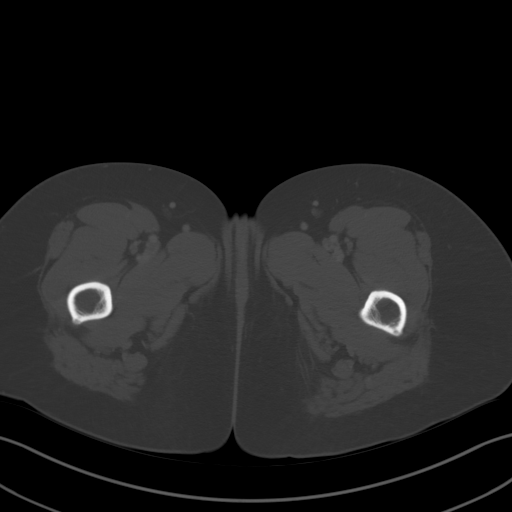
[im 15/90  soft-tissue]
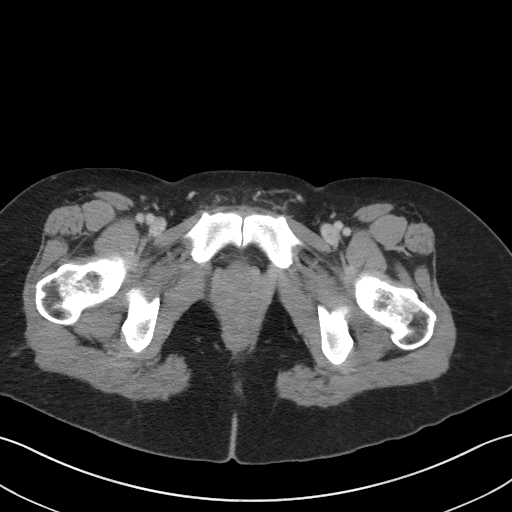
[im 19/90  soft-tissue]
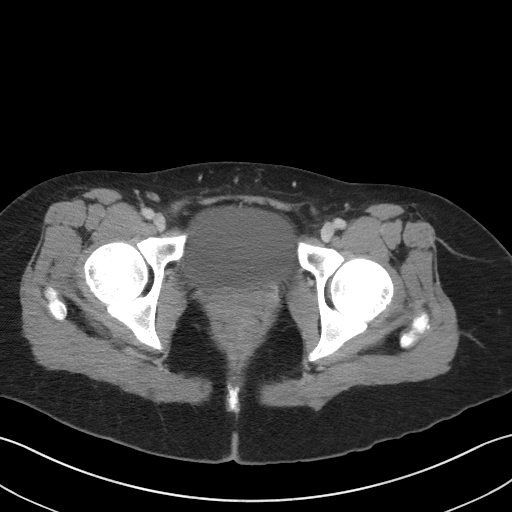
[im 24/90  soft-tissue]
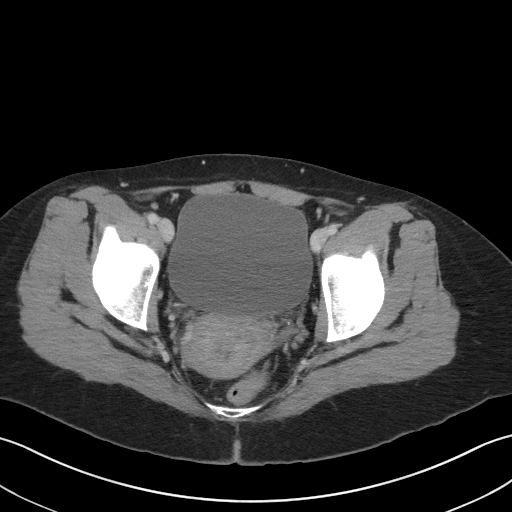
[im 33/90  soft-tissue]
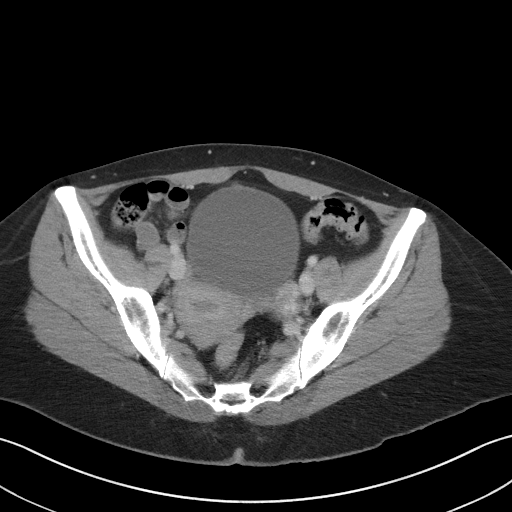
[im 38/90  soft-tissue]
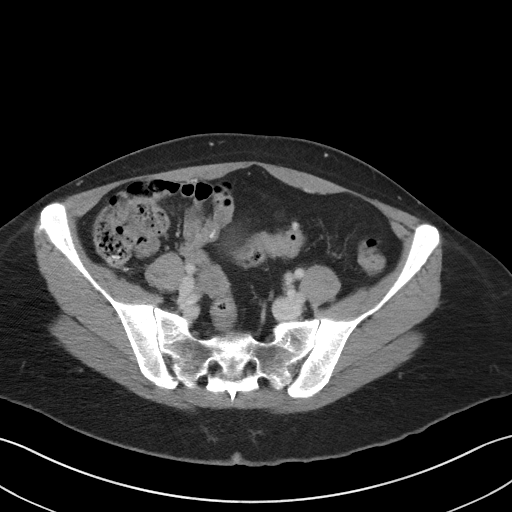
[im 47/90  soft-tissue]
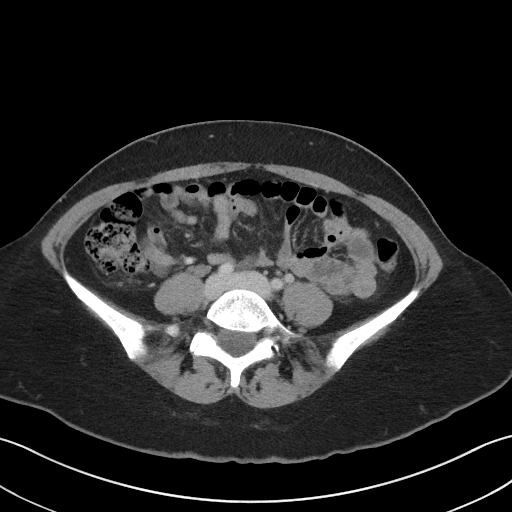
[im 52/90  soft-tissue]
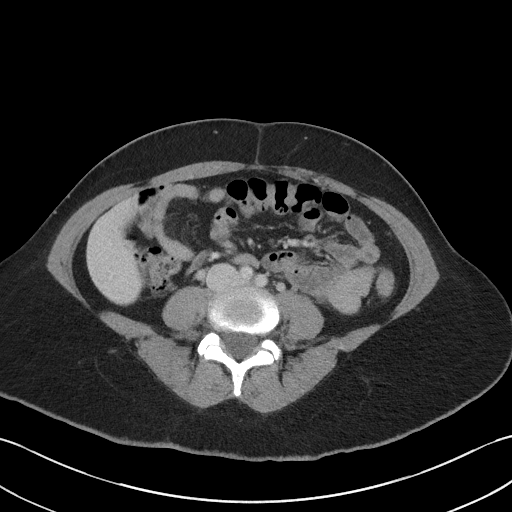
[im 57/90  soft-tissue]
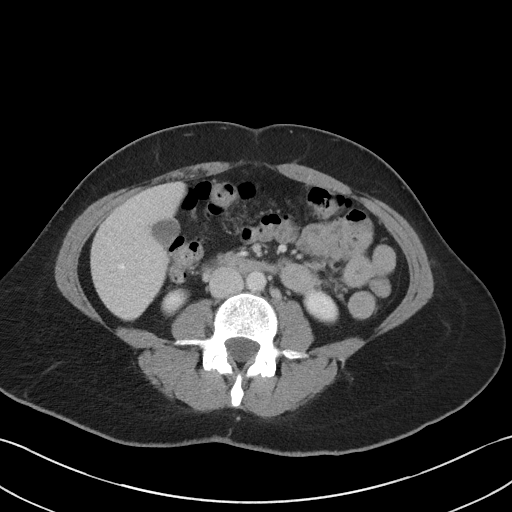
[im 57/90  bone]
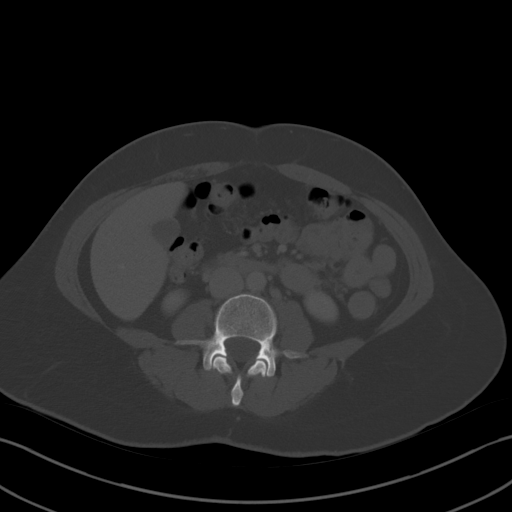
[im 66/90  soft-tissue]
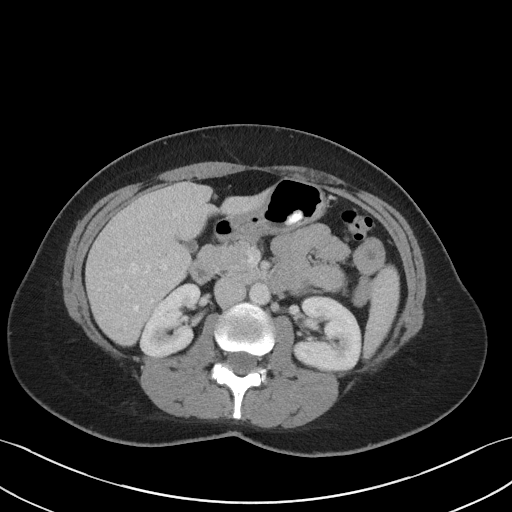
[im 71/90  soft-tissue]
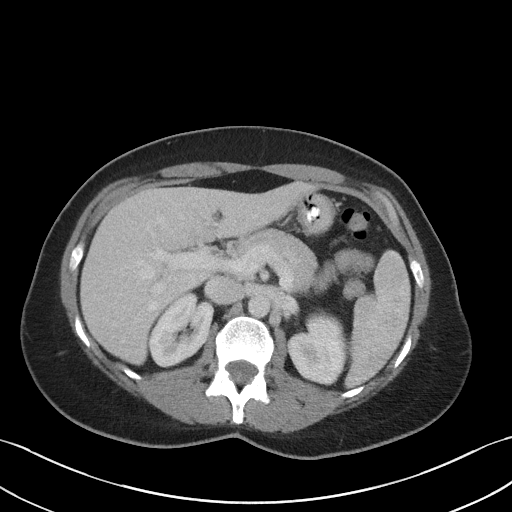
[im 75/90  soft-tissue]
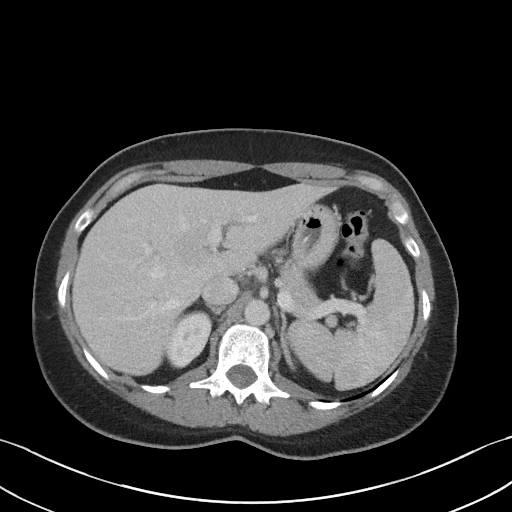
[im 85/90  soft-tissue]
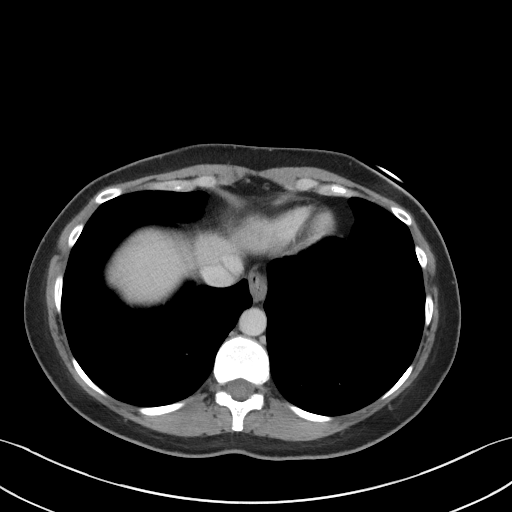

[Series 5: coronal st · coronal · 0.75mm/px · 3 of 80 slices shown]
[im 27/80  soft-tissue]
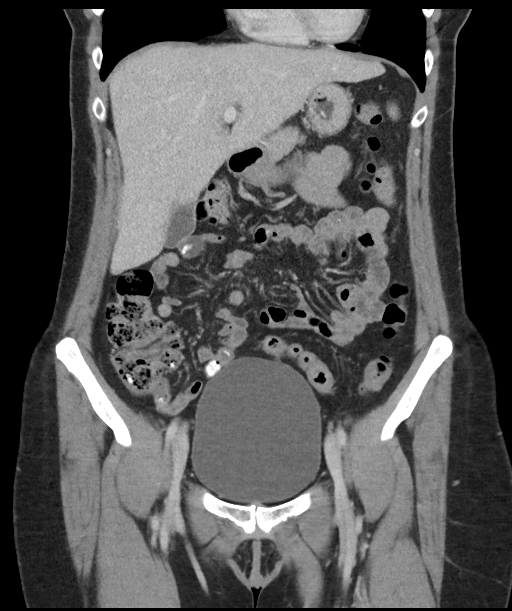
[im 36/80  soft-tissue]
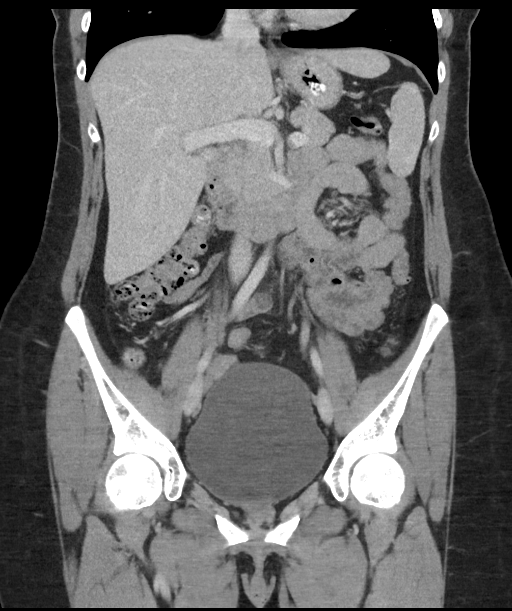
[im 44/80  soft-tissue]
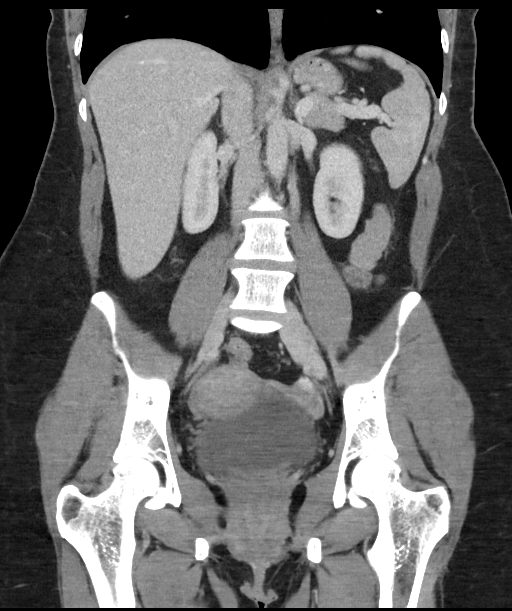

[16 of 46 positions shown; findings below may reference images not displayed]

FINDINGS: Lower chest: The visualized heart size within normal limits. No
pericardial fluid/thickening.

No hiatal hernia.

The visualized portions of the lungs are clear.

Hepatobiliary: The liver is normal in density without focal
abnormality.The main portal vein is patent. No evidence of calcified
gallstones, gallbladder wall thickening or biliary dilatation.

Pancreas: Unremarkable. No pancreatic ductal dilatation or
surrounding inflammatory changes.

Spleen: Normal in size without focal abnormality.

Adrenals/Urinary Tract: Both adrenal glands appear normal. The
kidneys and collecting system appear normal without evidence of
urinary tract calculus or hydronephrosis. Bladder is unremarkable.

Stomach/Bowel: The stomach, small bowel, and colon are normal in
appearance. No inflammatory changes, wall thickening, or obstructive
findings.Scattered colonic diverticula are noted. The patient is
status post appendectomy.

Vascular/Lymphatic: There are no enlarged mesenteric,
retroperitoneal, or pelvic lymph nodes. No significant vascular
findings are present.

Reproductive: Within the left adnexa there is a probable small
corpus luteum cyst. The uterus is grossly unremarkable.

Other: No evidence of abdominal wall mass or hernia.

Musculoskeletal: No acute or significant osseous findings.
IMPRESSION: Scattered colonic diverticula without definite diverticulitis.

Probable left corpus luteum cyst. If patient's pain persists, would
recommend dedicated pelvic ultrasound for further evaluation.
